# Patient Record
Sex: Female | Born: 1940 | Race: White | Hispanic: No | Marital: Married | Smoking: Former smoker
Health system: Southern US, Community
[De-identification: ages and names within clinical notes are randomized; demographics above are authoritative.]

## PROBLEM LIST (undated history)

## (undated) DIAGNOSIS — E785 Hyperlipidemia, unspecified: Secondary | ICD-10-CM

## (undated) DIAGNOSIS — I10 Essential (primary) hypertension: Secondary | ICD-10-CM

## (undated) DIAGNOSIS — E119 Type 2 diabetes mellitus without complications: Secondary | ICD-10-CM

## (undated) DIAGNOSIS — C439 Malignant melanoma of skin, unspecified: Secondary | ICD-10-CM

## (undated) DIAGNOSIS — F329 Major depressive disorder, single episode, unspecified: Secondary | ICD-10-CM

## (undated) DIAGNOSIS — C801 Malignant (primary) neoplasm, unspecified: Secondary | ICD-10-CM

## (undated) DIAGNOSIS — F419 Anxiety disorder, unspecified: Secondary | ICD-10-CM

## (undated) DIAGNOSIS — F0150 Vascular dementia without behavioral disturbance: Secondary | ICD-10-CM

## (undated) DIAGNOSIS — F32A Depression, unspecified: Secondary | ICD-10-CM

## (undated) DIAGNOSIS — M810 Age-related osteoporosis without current pathological fracture: Secondary | ICD-10-CM

## (undated) DIAGNOSIS — M1990 Unspecified osteoarthritis, unspecified site: Secondary | ICD-10-CM

## (undated) DIAGNOSIS — Z87891 Personal history of nicotine dependence: Secondary | ICD-10-CM

## (undated) HISTORY — DX: Age-related osteoporosis without current pathological fracture: M81.0

## (undated) HISTORY — PX: MELANOMA EXCISION: SHX5266

## (undated) HISTORY — PX: TUBAL LIGATION: SHX77

## (undated) HISTORY — DX: Depression, unspecified: F32.A

## (undated) HISTORY — DX: Major depressive disorder, single episode, unspecified: F32.9

## (undated) HISTORY — DX: Type 2 diabetes mellitus without complications: E11.9

## (undated) HISTORY — DX: Anxiety disorder, unspecified: F41.9

---

## 2005-02-17 ENCOUNTER — Ambulatory Visit: Payer: Self-pay | Admitting: Obstetrics and Gynecology

## 2005-06-09 ENCOUNTER — Ambulatory Visit: Payer: Self-pay | Admitting: Unknown Physician Specialty

## 2005-11-24 LAB — HM COLONOSCOPY

## 2006-03-23 ENCOUNTER — Ambulatory Visit: Payer: Self-pay | Admitting: Obstetrics and Gynecology

## 2007-03-29 ENCOUNTER — Ambulatory Visit: Payer: Self-pay | Admitting: Obstetrics and Gynecology

## 2008-03-30 ENCOUNTER — Ambulatory Visit: Payer: Self-pay | Admitting: Obstetrics and Gynecology

## 2009-04-02 ENCOUNTER — Ambulatory Visit: Payer: Self-pay | Admitting: Obstetrics and Gynecology

## 2010-04-03 ENCOUNTER — Ambulatory Visit: Payer: Self-pay | Admitting: Obstetrics and Gynecology

## 2010-09-26 ENCOUNTER — Ambulatory Visit: Payer: Self-pay | Admitting: Internal Medicine

## 2011-07-02 ENCOUNTER — Ambulatory Visit: Payer: Self-pay | Admitting: Obstetrics and Gynecology

## 2011-11-25 HISTORY — PX: BUNIONECTOMY: SHX129

## 2012-07-05 ENCOUNTER — Ambulatory Visit: Payer: Self-pay | Admitting: Obstetrics and Gynecology

## 2014-03-24 LAB — HM MAMMOGRAPHY: HM Mammogram: NORMAL

## 2014-04-20 ENCOUNTER — Ambulatory Visit: Payer: Self-pay | Admitting: Obstetrics and Gynecology

## 2014-11-13 LAB — LIPID PANEL
CHOLESTEROL: 231 mg/dL — AB (ref 0–200)
HDL: 67 mg/dL (ref 35–70)
LDL Cholesterol: 138 mg/dL
Triglycerides: 132 mg/dL (ref 40–160)

## 2014-11-13 LAB — HEMOGLOBIN A1C: Hgb A1c MFr Bld: 6.7 % — AB (ref 4.0–6.0)

## 2014-11-13 LAB — BASIC METABOLIC PANEL
BUN: 11 mg/dL (ref 4–21)
CREATININE: 0.8 mg/dL (ref <=1.1)

## 2015-03-15 ENCOUNTER — Encounter: Payer: Self-pay | Admitting: Internal Medicine

## 2015-03-15 DIAGNOSIS — E559 Vitamin D deficiency, unspecified: Secondary | ICD-10-CM | POA: Insufficient documentation

## 2015-03-15 DIAGNOSIS — N3941 Urge incontinence: Secondary | ICD-10-CM | POA: Insufficient documentation

## 2015-03-15 DIAGNOSIS — E785 Hyperlipidemia, unspecified: Secondary | ICD-10-CM

## 2015-03-15 DIAGNOSIS — J302 Other seasonal allergic rhinitis: Secondary | ICD-10-CM | POA: Insufficient documentation

## 2015-03-15 DIAGNOSIS — E1169 Type 2 diabetes mellitus with other specified complication: Secondary | ICD-10-CM | POA: Insufficient documentation

## 2015-03-15 DIAGNOSIS — E749 Disorder of carbohydrate metabolism, unspecified: Secondary | ICD-10-CM | POA: Insufficient documentation

## 2015-03-15 DIAGNOSIS — I10 Essential (primary) hypertension: Secondary | ICD-10-CM | POA: Insufficient documentation

## 2015-03-15 DIAGNOSIS — M810 Age-related osteoporosis without current pathological fracture: Secondary | ICD-10-CM | POA: Insufficient documentation

## 2015-05-24 ENCOUNTER — Encounter: Payer: Self-pay | Admitting: Internal Medicine

## 2015-05-24 ENCOUNTER — Ambulatory Visit (INDEPENDENT_AMBULATORY_CARE_PROVIDER_SITE_OTHER): Payer: Medicare Other | Admitting: Internal Medicine

## 2015-05-24 ENCOUNTER — Encounter (INDEPENDENT_AMBULATORY_CARE_PROVIDER_SITE_OTHER): Payer: Self-pay

## 2015-05-24 VITALS — BP 110/60 | HR 84 | Ht 62.0 in | Wt 184.4 lb

## 2015-05-24 DIAGNOSIS — M25552 Pain in left hip: Secondary | ICD-10-CM

## 2015-05-24 DIAGNOSIS — Z1239 Encounter for other screening for malignant neoplasm of breast: Secondary | ICD-10-CM | POA: Diagnosis not present

## 2015-05-24 DIAGNOSIS — I10 Essential (primary) hypertension: Secondary | ICD-10-CM | POA: Diagnosis not present

## 2015-05-24 DIAGNOSIS — E749 Disorder of carbohydrate metabolism, unspecified: Secondary | ICD-10-CM

## 2015-05-24 DIAGNOSIS — G8929 Other chronic pain: Secondary | ICD-10-CM | POA: Insufficient documentation

## 2015-05-24 DIAGNOSIS — E785 Hyperlipidemia, unspecified: Secondary | ICD-10-CM | POA: Diagnosis not present

## 2015-05-24 NOTE — Progress Notes (Signed)
Date:  05/24/2015   Name:  Melody Jones   DOB:  [birth year]   MRN:  [id number]   Chief Complaint: Follow up DM Diabetes She presents for her follow-up (of glucose intolerance/pre diabetes) diabetic visit. Her disease course has been stable. Pertinent negatives for hypoglycemia include no headaches. There are no diabetic associated symptoms. Pertinent negatives for diabetes include no chest pain. Symptoms are stable. Pertinent negatives for diabetic complications include no retinopathy. Current diabetic treatment includes diet. She is compliant with treatment most of the time. Home blood sugar record trend: she is not checking blood sugar.  Hypertension This is a chronic problem. The current episode started more than 1 year ago. The problem is unchanged. The problem is controlled. Pertinent negatives include no chest pain, headaches, palpitations or shortness of breath. There are no associated agents to hypertension. Risk factors for coronary artery disease include diabetes mellitus and dyslipidemia. Past treatments include ACE inhibitors. The current treatment provides significant improvement. There are no compliance problems.  There is no history of kidney disease, CAD/MI or retinopathy. There is no history of chronic renal disease.  Hyperlipidemia This is a chronic problem. The problem is controlled. Recent lipid tests were reviewed and are variable. Exacerbating diseases include diabetes. She has no history of chronic renal disease or liver disease. Pertinent negatives include no chest pain or shortness of breath. Current antihyperlipidemic treatment includes statins and diet change. The current treatment provides moderate improvement of lipids. There are no compliance problems.   Hip Pain  The incident occurred more than 1 week ago. There was no injury mechanism. The pain is present in the left leg and left hip. The quality of the pain is described as aching. The pain is mild. The pain has been  fluctuating since onset. Pertinent negatives include no numbness or tingling. The symptoms are aggravated by movement and weight bearing. She has tried NSAIDs (seen by Dr. Tamala Julian - recommended nsaids, no injections or surgery needed) for the symptoms.    Review of Systems:  Review of Systems  Constitutional: Negative for fever, activity change, appetite change and unexpected weight change.  HENT: Negative for hearing loss.   Eyes: Negative for visual disturbance.  Respiratory: Negative for cough, chest tightness and shortness of breath.   Cardiovascular: Negative for chest pain, palpitations and leg swelling.  Gastrointestinal: Negative for abdominal pain.  Genitourinary: Positive for frequency. Negative for dysuria.  Musculoskeletal: Positive for arthralgias.  Neurological: Negative for tingling, numbness and headaches.    Patient Active Problem List   Diagnosis Date Noted  . Carbohydrate metabolic effects 77/41/2878  . Dyslipidemia 03/15/2015  . Essential (primary) hypertension 03/15/2015  . OP (osteoporosis) 03/15/2015  . Allergic rhinitis, seasonal 03/15/2015  . Urge incontinence 03/15/2015  . Avitaminosis D 03/15/2015    Prior to Admission medications   Medication Sig Start Date End Date Taking? Authorizing Provider  atorvastatin (LIPITOR) 10 MG tablet Take 1 tablet by mouth daily. 11/13/14   Historical Provider, MD  fesoterodine (TOVIAZ) 8 MG TB24 tablet Take 1 tablet by mouth daily.    Historical Provider, MD  lisinopril (PRINIVIL,ZESTRIL) 20 MG tablet Take 1 tablet by mouth daily. 10/08/14   Historical Provider, MD    Allergies no known allergies  Past Surgical History  Procedure Laterality Date  . Tubal ligation    . Bunionectomy  2013    History  Substance Use Topics  . Smoking status: Never Smoker   . Smokeless tobacco: Not on file  . Alcohol Use:  Not on file   Lab Results  Component Value Date   HGBA1C 6.7* 11/13/2014   Lab Results  Component Value  Date   CHOL 231* 11/13/2014   HDL 67 11/13/2014   LDLCALC 138 11/13/2014   TRIG 132 11/13/2014      Medication list has been reviewed and updated.  Physical Examination:  Physical Exam  Constitutional: She is oriented to person, place, and time. She appears well-developed and well-nourished.  Neck: Normal range of motion. Neck supple. No tracheal tenderness present. Carotid bruit is not present. No thyromegaly present.  Cardiovascular: Normal rate, regular rhythm and normal heart sounds.   Pulmonary/Chest: Effort normal and breath sounds normal. No respiratory distress. She has no wheezes.  Musculoskeletal: She exhibits tenderness (left hip). She exhibits no edema.       Right knee: She exhibits no swelling and no effusion.       Left knee: She exhibits no swelling and no effusion.  Lymphadenopathy:    She has no cervical adenopathy.  Neurological: She is alert and oriented to person, place, and time. She has normal reflexes.  Psychiatric: She has a normal mood and affect.  Nursing note and vitals reviewed.   BP 110/60 mmHg  Pulse 84  Ht 5\' 2"  (1.575 m)  Wt 184 lb 6.4 oz (83.643 kg)  BMI 33.72 kg/m2  Assessment and Plan: 1. Carbohydrate metabolic effects Continue low carb diet - Hemoglobin A1c  2. Essential (primary) hypertension controlled - Comprehensive metabolic panel  3. Dyslipidemia On statin therapy  4. Breast cancer screening - MM DIGITAL SCREENING BILATERAL; Future  5. Chronic left hip pain Continue mobic as needed Consider regular exercise program such as water aerobics   Halina Maidens, MD [facility]  05/24/2015

## 2015-05-25 LAB — COMPREHENSIVE METABOLIC PANEL
ALT: 20 IU/L (ref 0–32)
AST: 17 IU/L (ref 0–40)
Albumin/Globulin Ratio: 1.7 (ref 1.1–2.5)
Albumin: 4.6 g/dL (ref 3.5–4.8)
Alkaline Phosphatase: 60 IU/L (ref 39–117)
BUN/Creatinine Ratio: 18 (ref 11–26)
BUN: 12 mg/dL (ref 8–27)
Bilirubin Total: 0.4 mg/dL (ref 0.0–1.2)
CO2: 25 mmol/L (ref 18–29)
Calcium: 9.5 mg/dL (ref 8.7–10.3)
Chloride: 103 mmol/L (ref 97–108)
Creatinine, Ser: 0.66 mg/dL (ref 0.57–1.00)
GFR calc Af Amer: 101 mL/min/{1.73_m2} (ref >59)
GFR calc non Af Amer: 88 mL/min/{1.73_m2} (ref >59)
Globulin, Total: 2.7 g/dL (ref 1.5–4.5)
Glucose: 147 mg/dL — ABNORMAL HIGH (ref 65–99)
Potassium: 4.7 mmol/L (ref 3.5–5.2)
SODIUM: 142 mmol/L (ref 134–144)
TOTAL PROTEIN: 7.3 g/dL (ref 6.0–8.5)

## 2015-05-25 LAB — HEMOGLOBIN A1C
Est. average glucose Bld gHb Est-mCnc: 148 mg/dL
Hgb A1c MFr Bld: 6.8 % — ABNORMAL HIGH (ref 4.8–5.6)

## 2015-06-05 DIAGNOSIS — Z85820 Personal history of malignant melanoma of skin: Secondary | ICD-10-CM | POA: Insufficient documentation

## 2015-06-13 ENCOUNTER — Encounter: Payer: Self-pay | Admitting: *Deleted

## 2015-06-14 ENCOUNTER — Ambulatory Visit: Payer: Medicare Other | Admitting: Anesthesiology

## 2015-06-14 ENCOUNTER — Ambulatory Visit
Admission: RE | Admit: 2015-06-14 | Discharge: 2015-06-14 | Disposition: A | Discharge status: Home Health | Payer: Medicare Other | Source: Ambulatory Visit | Attending: Unknown Physician Specialty | Admitting: Unknown Physician Specialty

## 2015-06-14 ENCOUNTER — Encounter
Admission: RE | Disposition: A | Discharge status: Home Health | Payer: Self-pay | Source: Ambulatory Visit | Attending: Unknown Physician Specialty

## 2015-06-14 ENCOUNTER — Encounter: Payer: Self-pay | Admitting: *Deleted

## 2015-06-14 DIAGNOSIS — D123 Benign neoplasm of transverse colon: Secondary | ICD-10-CM | POA: Diagnosis not present

## 2015-06-14 DIAGNOSIS — Z87891 Personal history of nicotine dependence: Secondary | ICD-10-CM | POA: Insufficient documentation

## 2015-06-14 DIAGNOSIS — Z85820 Personal history of malignant melanoma of skin: Secondary | ICD-10-CM | POA: Diagnosis not present

## 2015-06-14 DIAGNOSIS — K640 First degree hemorrhoids: Secondary | ICD-10-CM | POA: Diagnosis not present

## 2015-06-14 DIAGNOSIS — Z1211 Encounter for screening for malignant neoplasm of colon: Secondary | ICD-10-CM | POA: Diagnosis not present

## 2015-06-14 DIAGNOSIS — E785 Hyperlipidemia, unspecified: Secondary | ICD-10-CM | POA: Diagnosis not present

## 2015-06-14 DIAGNOSIS — K5730 Diverticulosis of large intestine without perforation or abscess without bleeding: Secondary | ICD-10-CM | POA: Diagnosis not present

## 2015-06-14 DIAGNOSIS — K621 Rectal polyp: Secondary | ICD-10-CM | POA: Diagnosis not present

## 2015-06-14 DIAGNOSIS — Z6833 Body mass index (BMI) 33.0-33.9, adult: Secondary | ICD-10-CM | POA: Insufficient documentation

## 2015-06-14 DIAGNOSIS — E669 Obesity, unspecified: Secondary | ICD-10-CM | POA: Diagnosis not present

## 2015-06-14 DIAGNOSIS — I10 Essential (primary) hypertension: Secondary | ICD-10-CM | POA: Insufficient documentation

## 2015-06-14 HISTORY — DX: Hyperlipidemia, unspecified: E78.5

## 2015-06-14 HISTORY — DX: Essential (primary) hypertension: I10

## 2015-06-14 HISTORY — PX: COLONOSCOPY: SHX5424

## 2015-06-14 HISTORY — DX: Malignant (primary) neoplasm, unspecified: C80.1

## 2015-06-14 SURGERY — COLONOSCOPY
Anesthesia: General

## 2015-06-14 MED ORDER — SODIUM CHLORIDE 0.9 % IV SOLN: INTRAVENOUS | Status: DC

## 2015-06-14 MED ORDER — PROPOFOL INFUSION 10 MG/ML OPTIME
INTRAVENOUS | Status: DC | PRN
  Administered 2015-06-14: 120 ug/kg/min via INTRAVENOUS

## 2015-06-14 MED ORDER — MIDAZOLAM HCL 5 MG/5ML IJ SOLN
INTRAMUSCULAR | Status: DC | PRN
  Administered 2015-06-14: 1 mg via INTRAVENOUS

## 2015-06-14 MED ORDER — FENTANYL CITRATE (PF) 100 MCG/2ML IJ SOLN
INTRAMUSCULAR | Status: DC | PRN
  Administered 2015-06-14: 50 ug via INTRAVENOUS

## 2015-06-14 MED ORDER — PROPOFOL 10 MG/ML IV BOLUS
INTRAVENOUS | Status: DC | PRN
  Administered 2015-06-14: 8 mg via INTRAVENOUS
  Administered 2015-06-14: 55 mg via INTRAVENOUS

## 2015-06-14 MED ORDER — SODIUM CHLORIDE 0.9 % IV SOLN
INTRAVENOUS | Status: DC
  Administered 2015-06-14: 10:00:00 via INTRAVENOUS

## 2015-06-14 NOTE — H&P (Signed)
Primary Care Physician:  Halina Maidens, MD Primary Gastroenterologist:  Dr. Vira Agar  Pre-Procedure History & Physical: HPI:  Melody Jones is a 74 y.o. female is here for an colonoscopy.   Past Medical History  Diagnosis Date  . Hypertension   . Hyperlipidemia   . Cancer     Melanoma LT Shoulder    Past Surgical History  Procedure Laterality Date  . Tubal ligation    . Bunionectomy  2013  . Melanoma excision Left Shoulder    Prior to Admission medications   Medication Sig Start Date End Date Taking? Authorizing Provider  fesoterodine (TOVIAZ) 8 MG TB24 tablet Take 1 tablet by mouth daily.   Yes Historical Provider, MD  lisinopril (PRINIVIL,ZESTRIL) 20 MG tablet Take 1 tablet by mouth daily. 10/08/14  Yes Historical Provider, MD  VITAMIN D, ERGOCALCIFEROL, PO Take 1 tablet by mouth.   Yes Historical Provider, MD  atorvastatin (LIPITOR) 10 MG tablet Take 1 tablet by mouth daily. 11/13/14   Historical Provider, MD  meloxicam (MOBIC) 15 MG tablet Take 15 mg by mouth daily as needed for pain.    Historical Provider, MD    Allergies as of 06/13/2015  . (No Known Allergies)    Family History  Problem Relation Age of Onset  . Alcohol abuse Mother   . Stroke Father     History   Social History  . Marital Status: Married    Spouse Name: N/A  . Number of Children: N/A  . Years of Education: N/A   Occupational History  . Not on file.   Social History Main Topics  . Smoking status: Former Research scientist (life sciences)  . Smokeless tobacco: Never Used  . Alcohol Use: 0.0 oz/week    0 Standard drinks or equivalent per week  . Drug Use: No  . Sexual Activity: Not on file   Other Topics Concern  . Not on file   Social History Narrative    Review of Systems: See HPI, otherwise negative ROS  Physical Exam: BP 100/80 mmHg  Pulse 93  Temp(Src) 96.6 F (35.9 C) (Tympanic)  Resp 18  Ht 5\' 1"  (1.549 m)  Wt 80.74 kg (178 lb)  BMI 33.65 kg/m2  SpO2 97% General:   Alert,  pleasant  and cooperative in NAD Head:  Normocephalic and atraumatic. Neck:  Supple; no masses or thyromegaly. Lungs:  Clear throughout to auscultation.    Heart:  Regular rate and rhythm. Abdomen:  Soft, nontender and nondistended. Normal bowel sounds, without guarding, and without rebound.   Neurologic:  Alert and  oriented x4;  grossly normal neurologically.  Impression/Plan: Melody Novas Weight is here for an colonoscopy to be performed for screening  Risks, benefits, limitations, and alternatives regarding  colonoscopy have been reviewed with the patient.  Questions have been answered.  All parties agreeable.   Gaylyn Cheers, MD  06/14/2015, 10:50 AM   Primary Care Physician:  Halina Maidens, MD Primary Gastroenterologist:  Dr. Vira Agar  Pre-Procedure History & Physical: HPI:  Melody Jones is a 74 y.o. female is here for an colonoscopy.   Past Medical History  Diagnosis Date  . Hypertension   . Hyperlipidemia   . Cancer     Melanoma LT Shoulder    Past Surgical History  Procedure Laterality Date  . Tubal ligation    . Bunionectomy  2013  . Melanoma excision Left Shoulder    Prior to Admission medications   Medication Sig Start Date End Date Taking? Authorizing Provider  fesoterodine (TOVIAZ) 8 MG TB24 tablet Take 1 tablet by mouth daily.   Yes Historical Provider, MD  lisinopril (PRINIVIL,ZESTRIL) 20 MG tablet Take 1 tablet by mouth daily. 10/08/14  Yes Historical Provider, MD  VITAMIN D, ERGOCALCIFEROL, PO Take 1 tablet by mouth.   Yes Historical Provider, MD  atorvastatin (LIPITOR) 10 MG tablet Take 1 tablet by mouth daily. 11/13/14   Historical Provider, MD  meloxicam (MOBIC) 15 MG tablet Take 15 mg by mouth daily as needed for pain.    Historical Provider, MD    Allergies as of 06/13/2015  . (No Known Allergies)    Family History  Problem Relation Age of Onset  . Alcohol abuse Mother   . Stroke Father     History   Social History  . Marital Status: Married     Spouse Name: N/A  . Number of Children: N/A  . Years of Education: N/A   Occupational History  . Not on file.   Social History Main Topics  . Smoking status: Former Research scientist (life sciences)  . Smokeless tobacco: Never Used  . Alcohol Use: 0.0 oz/week    0 Standard drinks or equivalent per week  . Drug Use: No  . Sexual Activity: Not on file   Other Topics Concern  . Not on file   Social History Narrative    Review of Systems: See HPI, otherwise negative ROS  Physical Exam: BP 100/80 mmHg  Pulse 93  Temp(Src) 96.6 F (35.9 C) (Tympanic)  Resp 18  Ht 5\' 1"  (1.549 m)  Wt 80.74 kg (178 lb)  BMI 33.65 kg/m2  SpO2 97% General:   Alert,  pleasant and cooperative in NAD Head:  Normocephalic and atraumatic. Neck:  Supple; no masses or thyromegaly. Lungs:  Clear throughout to auscultation.    Heart:  Regular rate and rhythm. Abdomen:  Soft, nontender and nondistended. Normal bowel sounds, without guarding, and without rebound.   Neurologic:  Alert and  oriented x4;  grossly normal neurologically.  Impression/Plan: Melody Jones is here for an colonoscopy to be performed for screening  Risks, benefits, limitations, and alternatives regarding  colonoscopy have been reviewed with the patient.  Questions have been answered.  All parties agreeable.   Gaylyn Cheers, MD  06/14/2015, 10:50 AM

## 2015-06-14 NOTE — Anesthesia Preprocedure Evaluation (Signed)
Anesthesia Evaluation  Patient identified by MRN, date of birth, ID band Patient awake    Reviewed: Allergy & Precautions, NPO status , Patient's Chart, lab work & pertinent test results  Airway Mallampati: III       Dental  (+) Caps   Pulmonary former smoker,    Pulmonary exam normal       Cardiovascular hypertension, Pt. on medications Normal cardiovascular exam    Neuro/Psych    GI/Hepatic negative GI ROS, Neg liver ROS,   Endo/Other  negative endocrine ROS  Renal/GU negative Renal ROS     Musculoskeletal negative musculoskeletal ROS (+)   Abdominal (+) + obese,   Peds  Hematology negative hematology ROS (+)   Anesthesia Other Findings   Reproductive/Obstetrics negative OB ROS                             Anesthesia Physical Anesthesia Plan  ASA: II  Anesthesia Plan: General   Post-op Pain Management:    Induction: Intravenous  Airway Management Planned: Nasal Cannula  Additional Equipment:   Intra-op Plan:   Post-operative Plan:   Informed Consent: I have reviewed the patients History and Physical, chart, labs and discussed the procedure including the risks, benefits and alternatives for the proposed anesthesia with the patient or authorized representative who has indicated his/her understanding and acceptance.     Plan Discussed with: CRNA  Anesthesia Plan Comments:         Anesthesia Quick Evaluation

## 2015-06-14 NOTE — Op Note (Addendum)
[facility] Gastroenterology Patient Name: Melody Jones Procedure Date: 06/14/2015 10:48 AM MRN: [id number] Account #: [id number] Date of Birth: 1941/11/05 Admit Type: Outpatient Age: 74 Room: [facility] ENDO ROOM 1 Gender: Female Note Status: Finalized Procedure:         Colonoscopy Indications:       Screening for colorectal malignant neoplasm Providers:         Manya Silvas, MD Referring MD:      Halina Maidens, MD (Referring MD) Medicines:         Propofol per Anesthesia Complications:     No immediate complications. Procedure:         Pre-Anesthesia Assessment:                    - After reviewing the risks and benefits, the patient was                     deemed in satisfactory condition to undergo the procedure.                    After obtaining informed consent, the colonoscope was                     passed under direct vision. Throughout the procedure, the                     patient's blood pressure, pulse, and oxygen saturations                     were monitored continuously. The Colonoscope was                     introduced through the anus and advanced to the the cecum,                     identified by appendiceal orifice and ileocecal valve. The                     colonoscopy was performed without difficulty. The patient                     tolerated the procedure well. The quality of the bowel                     preparation was good. Findings:      A diminutive polyp was found in the transverse colon. The polyp was       sessile. The polyp was removed with a jumbo cold forceps. Resection and       retrieval were complete.      A small polyp was found at the splenic flexure. The polyp was sessile.       The polyp was removed with a hot snare. Resection and retrieval were       complete.      A diminutive polyp was found in the rectum. The polyp was sessile. The       polyp was removed with a jumbo cold forceps. Resection and retrieval   were complete.      Multiple small-mouthed diverticula were found in the sigmoid colon and       in the descending colon.      Internal hemorrhoids were found during endoscopy. The hemorrhoids were       small and Grade I (internal hemorrhoids that do not prolapse).  The exam was otherwise without abnormality. Impression:        - One diminutive polyp in the transverse colon. Resected                     and retrieved.                    - One small polyp at the splenic flexure. Resected and                     retrieved.                    - One diminutive polyp in the rectum. Resected and                     retrieved.                    - Diverticulosis in the sigmoid colon and in the                     descending colon.                    - Internal hemorrhoids.                    - The examination was otherwise normal. Recommendation:    - Await pathology results. Manya Silvas, MD 06/14/2015 11:24:04 AM This report has been signed electronically. Number of Addenda: 0 Note Initiated On: 06/14/2015 10:48 AM Scope Withdrawal Time: 0 hours 17 minutes 7 seconds  Total Procedure Duration: 0 hours 25 minutes 41 seconds       [facility]

## 2015-06-14 NOTE — Anesthesia Postprocedure Evaluation (Signed)
  Anesthesia Post-op Note  Patient: Melody Jones  Procedure(s) Performed: Procedure(s): COLONOSCOPY (N/A)  Anesthesia type:General  Patient location: PACU  Post pain: Pain level controlled  Post assessment: Post-op Vital signs reviewed, Patient's Cardiovascular Status Stable, Respiratory Function Stable, Patent Airway and No signs of Nausea or vomiting  Post vital signs: Reviewed and stable  Last Vitals:  Filed Vitals:   06/14/15 1140  BP: 110/75  Pulse: 73  Temp:   Resp: 16    Level of consciousness: awake, alert  and patient cooperative  Complications: No apparent anesthesia complications

## 2015-06-14 NOTE — Transfer of Care (Signed)
Immediate Anesthesia Transfer of Care Note  Patient: Melody Jones  Procedure(s) Performed: Procedure(s): COLONOSCOPY (N/A)  Patient Location: PACU  Anesthesia Type:General  Level of Consciousness: sedated  Airway & Oxygen Therapy: Patient Spontanous Breathing and Patient connected to nasal cannula oxygen  Post-op Assessment: Report given to RN and Post -op Vital signs reviewed and stable  Post vital signs: Reviewed and stable  Last Vitals:  Filed Vitals:   06/14/15 1130  BP: 114/92  Pulse:   Temp: 36.1 C  Resp: 16    Complications: No apparent anesthesia complications

## 2015-06-15 LAB — SURGICAL PATHOLOGY

## 2015-06-18 ENCOUNTER — Encounter: Payer: Self-pay | Admitting: Unknown Physician Specialty

## 2015-10-17 ENCOUNTER — Ambulatory Visit
Admission: RE | Admit: 2015-10-17 | Discharge: 2015-10-17 | Disposition: A | Discharge status: Home / Self Care | Payer: Medicare Other | Source: Ambulatory Visit | Attending: Internal Medicine | Admitting: Internal Medicine

## 2015-10-17 DIAGNOSIS — Z1239 Encounter for other screening for malignant neoplasm of breast: Secondary | ICD-10-CM

## 2015-10-17 DIAGNOSIS — Z1231 Encounter for screening mammogram for malignant neoplasm of breast: Secondary | ICD-10-CM | POA: Diagnosis present

## 2015-11-10 ENCOUNTER — Other Ambulatory Visit: Payer: Self-pay | Admitting: Internal Medicine

## 2015-11-15 ENCOUNTER — Ambulatory Visit (INDEPENDENT_AMBULATORY_CARE_PROVIDER_SITE_OTHER): Payer: Medicare Other | Admitting: Internal Medicine

## 2015-11-15 ENCOUNTER — Encounter: Payer: Self-pay | Admitting: Internal Medicine

## 2015-11-15 ENCOUNTER — Other Ambulatory Visit: Payer: Self-pay | Admitting: Internal Medicine

## 2015-11-15 VITALS — BP 130/84 | HR 68 | Ht 62.0 in | Wt 178.0 lb

## 2015-11-15 DIAGNOSIS — Z0000 Encounter for general adult medical examination without abnormal findings: Secondary | ICD-10-CM

## 2015-11-15 DIAGNOSIS — E749 Disorder of carbohydrate metabolism, unspecified: Secondary | ICD-10-CM

## 2015-11-15 DIAGNOSIS — N3941 Urge incontinence: Secondary | ICD-10-CM | POA: Diagnosis not present

## 2015-11-15 DIAGNOSIS — I10 Essential (primary) hypertension: Secondary | ICD-10-CM

## 2015-11-15 DIAGNOSIS — E785 Hyperlipidemia, unspecified: Secondary | ICD-10-CM

## 2015-11-15 DIAGNOSIS — G8929 Other chronic pain: Secondary | ICD-10-CM | POA: Diagnosis not present

## 2015-11-15 DIAGNOSIS — M25552 Pain in left hip: Secondary | ICD-10-CM | POA: Diagnosis not present

## 2015-11-15 DIAGNOSIS — Z85820 Personal history of malignant melanoma of skin: Secondary | ICD-10-CM

## 2015-11-15 LAB — POCT URINALYSIS DIPSTICK
BILIRUBIN UA: NEGATIVE
Glucose, UA: NEGATIVE
Ketones, UA: NEGATIVE
LEUKOCYTES UA: NEGATIVE
NITRITE UA: NEGATIVE
PH UA: 5
PROTEIN UA: NEGATIVE
RBC UA: NEGATIVE
Spec Grav, UA: 1.01
UROBILINOGEN UA: 0.2

## 2015-11-15 MED ORDER — LISINOPRIL 20 MG PO TABS: 20.0000 mg | ORAL_TABLET | Freq: Every day | ORAL | Status: DC

## 2015-11-15 MED ORDER — ATORVASTATIN CALCIUM 10 MG PO TABS: 10.0000 mg | ORAL_TABLET | Freq: Every day | ORAL | Status: DC

## 2015-11-15 NOTE — Progress Notes (Signed)
Patient: Melody Jones, Female    DOB: Feb 21, 1941, 74 y.o.   MRN: [id number] Visit Date: 11/15/2015  Today's Provider: Halina Maidens, MD   Chief Complaint  Patient presents with  . Medicare Wellness  . Hypertension  . Hyperlipidemia   Subjective:    Annual wellness visit Melody Jones is a 74 y.o. female who presents today for her Subsequent Annual Wellness Visit. She feels well. She reports exercising occasionally. She reports she is sleeping well.   ----------------------------------------------------------- Hypertension This is a chronic problem. The current episode started more than 1 year ago. The problem is unchanged. The problem is controlled. Pertinent negatives include no chest pain, headaches or palpitations. Past treatments include ACE inhibitors. The current treatment provides significant improvement. There are no compliance problems.  There is no history of a thyroid problem. There is no history of chronic renal disease or hyperparathyroidism.  Hyperlipidemia This is a chronic problem. The current episode started more than 1 year ago. The problem is controlled. She has no history of chronic renal disease. Pertinent negatives include no chest pain. Current antihyperlipidemic treatment includes statins. The current treatment provides significant improvement of lipids.  Urinary Frequency  This is a chronic problem. The problem occurs every urination. Associated symptoms include frequency and urgency. Treatments tried: [place]. The treatment provided significant relief.    Review of Systems  Constitutional: Negative for fever, fatigue and unexpected weight change.  HENT: Negative for hearing loss, sore throat, tinnitus and trouble swallowing.   Eyes: Negative for visual disturbance.  Cardiovascular: Negative for chest pain, palpitations and leg swelling.  Gastrointestinal: Negative for abdominal pain (occasional gerd), diarrhea, constipation and blood in stool.    Endocrine: Negative for polydipsia and polyuria.  Genitourinary: Positive for urgency and frequency. Negative for vaginal discharge and pelvic pain.  Musculoskeletal: Positive for arthralgias.  Skin: Negative for color change and rash.  Allergic/Immunologic: Negative for food allergies.  Neurological: Negative for tremors, syncope, speech difficulty, light-headedness and headaches.  Hematological: Negative for adenopathy.  Psychiatric/Behavioral: Negative for sleep disturbance, dysphoric mood and decreased concentration.    Social History   Social History  . Marital Status: Married    Spouse Name: N/A  . Number of Children: N/A  . Years of Education: N/A   Occupational History  . Not on file.   Social History Main Topics  . Smoking status: Former Research scientist (life sciences)  . Smokeless tobacco: Never Used  . Alcohol Use: 0.0 oz/week    0 Standard drinks or equivalent per week  . Drug Use: No  . Sexual Activity: Not Currently   Other Topics Concern  . Not on file   Social History Narrative    Patient Active Problem List   Diagnosis Date Noted  . Hx of malignant melanoma of skin 06/05/2015  . Chronic left hip pain 05/24/2015  . Carbohydrate metabolic effects ([place]) 0000000  . Dyslipidemia 03/15/2015  . Essential (primary) hypertension 03/15/2015  . OP (osteoporosis) 03/15/2015  . Allergic rhinitis, seasonal 03/15/2015  . Urge incontinence 03/15/2015  . Avitaminosis D 03/15/2015    Past Surgical History  Procedure Laterality Date  . Tubal ligation    . Bunionectomy  2013  . Melanoma excision Left Shoulder  . Colonoscopy N/A 06/14/2015    Procedure: COLONOSCOPY;  Surgeon: Manya Silvas, MD;  Location: [facility] ENDOSCOPY;  Service: Endoscopy;  Laterality: N/A;    Her family history includes Alcohol abuse in her mother; Stroke in her father.    Previous Medications   ATORVASTATIN (  LIPITOR) 10 MG TABLET    TAKE 1 TABLET AT BEDTIME   CHOLECALCIFEROL (VITAMIN D-1000 MAX ST) 1000  UNITS TABLET    Take 1 tablet by mouth daily.   FESOTERODINE (TOVIAZ) 8 MG TB24 TABLET    Take 1 tablet by mouth daily.   LISINOPRIL (PRINIVIL,ZESTRIL) 20 MG TABLET    Take 1 tablet by mouth daily.   MELOXICAM (MOBIC) 15 MG TABLET    Take 15 mg by mouth daily as needed for pain.   VITAMIN D, ERGOCALCIFEROL, PO    Take 1 tablet by mouth.    Patient Care Team: Glean Hess, MD as PCP - General (Family Medicine) Christophe Louis, MD as Referring Physician (Orthopedic Surgery)     Objective:   Vitals: BP 130/84 mmHg  Pulse 68  Ht 5\' 2"  (1.575 m)  Wt 178 lb (80.74 kg)  BMI 32.55 kg/m2  Physical Exam  Constitutional: She is oriented to person, place, and time. She appears well-developed and well-nourished. No distress.  HENT:  Head: Normocephalic and atraumatic.  Right Ear: Tympanic membrane and ear canal normal.  Left Ear: Tympanic membrane and ear canal normal.  Nose: Right sinus exhibits no maxillary sinus tenderness. Left sinus exhibits no maxillary sinus tenderness.  Mouth/Throat: Uvula is midline and oropharynx is clear and moist.  Eyes: Conjunctivae and EOM are normal. Right eye exhibits no discharge. Left eye exhibits no discharge. No scleral icterus.  Neck: Normal range of motion. Carotid bruit is not present. No erythema present. No thyromegaly present.  Cardiovascular: Normal rate, regular rhythm, normal heart sounds and normal pulses.   Pulmonary/Chest: Effort normal. No respiratory distress. She has no wheezes.  Abdominal: Soft. Bowel sounds are normal. There is no hepatosplenomegaly. There is no tenderness. There is no CVA tenderness.  Musculoskeletal: Normal range of motion.  Lymphadenopathy:    She has no cervical adenopathy.    She has no axillary adenopathy.  Neurological: She is alert and oriented to person, place, and time. She has normal reflexes. No cranial nerve deficit or sensory deficit.  Skin: Skin is warm, dry and intact. No rash noted.  Psychiatric:  She has a normal mood and affect. Her speech is normal and behavior is normal. Thought content normal.  Nursing note and vitals reviewed.   Activities of Daily Living In your present state of health, do you have any difficulty performing the following activities: 11/15/2015 05/24/2015  Hearing? N N  Vision? N N  Difficulty concentrating or making decisions? Y N  Walking or climbing stairs? Y N  Dressing or bathing? N N  Doing errands, shopping? N N    Fall Risk Assessment Fall Risk  11/15/2015 05/24/2015  Falls in the past year? No No      Depression Screen PHQ 2/9 Scores 11/15/2015 05/24/2015  PHQ - 2 Score 0 0    Cognitive Testing - 6-CIT   Correct? Score   What year is it? yes 0 Yes = 0    No = 4  What month is it? yes 0 Yes = 0    No = 3  Remember:     [address]ia [address], [place], [place]     What time is it? yes 0 Yes = 0    No = 3  Count backwards from 20 to 1 yes 0 Correct = 0    1 error = 2   More than 1 error = 4  Say the months of the year in reverse. yes  0 Correct = 0    1 error = 2   More than 1 error = 4  What address did I ask you to remember? yes 0 Correct = 0  1 error = 2    2 error = 4    3 error = 6    4 error = 8    All wrong = 10       TOTAL SCORE  0/28   Interpretation:  Normal  Normal (0-7) Abnormal (8-28)   Lab Results  Component Value Date   HGBA1C 6.8* 05/24/2015    Lab Results  Component Value Date   CHOL 231* 11/13/2014   HDL 67 11/13/2014   LDLCALC 138 11/13/2014   TRIG 132 11/13/2014    Lab Results  Component Value Date   CREATININE 0.66 05/24/2015      Medicare Annual Wellness Visit Summary:  Reviewed patient's Family Medical History Reviewed and updated list of patient's medical providers Assessment of cognitive impairment was done Assessed patient's functional ability Established a written schedule for health screening [place] Completed and Reviewed  Exercise Activities and Dietary  recommendations Goals    None      Immunization History  Administered Date(s) Administered  . Pneumococcal Conjugate-13 11/13/2014  . Pneumococcal Polysaccharide-23 11/24/2006  . Zoster 01/17/2015    Health Maintenance  Topic Date Due  . DEXA SCAN  12/14/2006  . PNA vac Low Risk Adult (2 of 2 - PPSV23) 11/14/2015  . TETANUS/TDAP  11/25/2018 (Originally 12/14/1960)  . INFLUENZA VACCINE  10/25/2019 (Originally 06/25/2015)  . MAMMOGRAM  10/16/2017  . COLONOSCOPY  06/13/2025  . ZOSTAVAX  Completed     Discussed health benefits of physical activity, and encouraged her to engage in regular exercise appropriate for her age and condition.    ------------------------------------------------------------------------------------------------------------   Assessment & Plan:  1. Medicare annual wellness visit, subsequent Patient declines flu and tetanus vaccines - POCT urinalysis dipstick  2. Essential (primary) hypertension controlled - CBC with Differential/Platelet - Comprehensive metabolic panel - TSH - lisinopril (PRINIVIL,ZESTRIL) 20 MG tablet; Take 1 tablet (20 mg total) by mouth daily.  Dispense: 90 tablet; Refill: 3  3. Dyslipidemia On statin therapy without side effects - Lipid panel - atorvastatin (LIPITOR) 10 MG tablet; Take 1 tablet (10 mg total) by mouth at bedtime.  Dispense: 90 tablet; Refill: 3  4. Carbohydrate metabolic effects (HCC) Borderline elevated A1C - continue low carb diet If A1C is higher, will consider medication and close followup - Hemoglobin A1c  5. Chronic left hip pain On mobic as needed; followed by Orthopedics  6. Hx of malignant melanoma of skin Will see Dermatology regularly  7. Urge incontinence Controlled with Toviaz daily   Halina Maidens, MD [facility]  11/15/2015

## 2015-11-16 LAB — CBC WITH DIFFERENTIAL/PLATELET
Basophils Absolute: 0 10*3/uL (ref 0.0–0.2)
Basos: 1 %
EOS (ABSOLUTE): 0.1 10*3/uL (ref 0.0–0.4)
Eos: 2 %
Hematocrit: 41.5 % (ref 34.0–46.6)
Hemoglobin: 14.2 g/dL (ref 11.1–15.9)
IMMATURE GRANULOCYTES: 0 %
Immature Grans (Abs): 0 10*3/uL (ref 0.0–0.1)
Lymphocytes Absolute: 1.9 10*3/uL (ref 0.7–3.1)
Lymphs: 34 %
MCH: 30 pg (ref 26.6–33.0)
MCHC: 34.2 g/dL (ref 31.5–35.7)
MCV: 88 fL (ref 79–97)
MONOS ABS: 0.4 10*3/uL (ref 0.1–0.9)
Monocytes: 6 %
NEUTROS PCT: 57 %
Neutrophils Absolute: 3.2 10*3/uL (ref 1.4–7.0)
Platelets: 311 10*3/uL (ref 150–379)
RBC: 4.74 x10E6/uL (ref 3.77–5.28)
RDW: 13.4 % (ref 12.3–15.4)
WBC: 5.5 10*3/uL (ref 3.4–10.8)

## 2015-11-16 LAB — COMPREHENSIVE METABOLIC PANEL
A/G RATIO: 1.5 (ref 1.1–2.5)
ALT: 20 IU/L (ref 0–32)
AST: 21 IU/L (ref 0–40)
Albumin: 4.3 g/dL (ref 3.5–4.8)
Alkaline Phosphatase: 67 IU/L (ref 39–117)
BUN/Creatinine Ratio: 22 (ref 11–26)
BUN: 17 mg/dL (ref 8–27)
Bilirubin Total: 0.5 mg/dL (ref 0.0–1.2)
CALCIUM: 9.5 mg/dL (ref 8.7–10.3)
CO2: 25 mmol/L (ref 18–29)
Chloride: 101 mmol/L (ref 96–106)
Creatinine, Ser: 0.76 mg/dL (ref 0.57–1.00)
GFR calc Af Amer: 90 mL/min/{1.73_m2} (ref >59)
GFR, EST NON AFRICAN AMERICAN: 78 mL/min/{1.73_m2} (ref >59)
Globulin, Total: 2.8 g/dL (ref 1.5–4.5)
Glucose: 132 mg/dL — ABNORMAL HIGH (ref 65–99)
POTASSIUM: 4.5 mmol/L (ref 3.5–5.2)
Sodium: 140 mmol/L (ref 134–144)
TOTAL PROTEIN: 7.1 g/dL (ref 6.0–8.5)

## 2015-11-16 LAB — HEMOGLOBIN A1C
Est. average glucose Bld gHb Est-mCnc: 148 mg/dL
Hgb A1c MFr Bld: 6.8 % — ABNORMAL HIGH (ref 4.8–5.6)

## 2015-11-16 LAB — LIPID PANEL
CHOL/HDL RATIO: 3 ratio (ref 0.0–4.4)
Cholesterol, Total: 183 mg/dL (ref 100–199)
HDL: 62 mg/dL (ref >39)
LDL Calculated: 99 mg/dL (ref 0–99)
TRIGLYCERIDES: 109 mg/dL (ref 0–149)
VLDL CHOLESTEROL CAL: 22 mg/dL (ref 5–40)

## 2015-11-16 LAB — TSH: TSH: 2.51 u[IU]/mL (ref 0.450–4.500)

## 2015-11-23 ENCOUNTER — Ambulatory Visit: Payer: Medicare Other | Admitting: Internal Medicine

## 2016-01-21 ENCOUNTER — Ambulatory Visit: Admission: RE | Admit: 2016-01-21 | Payer: Medicare Other | Source: Ambulatory Visit | Admitting: Ophthalmology

## 2016-01-21 ENCOUNTER — Encounter: Admission: RE | Payer: Self-pay | Source: Ambulatory Visit

## 2016-01-21 SURGERY — PHACOEMULSIFICATION, CATARACT, WITH IOL INSERTION
Anesthesia: Topical | Laterality: Right

## 2016-02-08 ENCOUNTER — Other Ambulatory Visit: Payer: Self-pay | Admitting: Internal Medicine

## 2016-06-26 ENCOUNTER — Other Ambulatory Visit: Payer: Self-pay | Admitting: Obstetrics and Gynecology

## 2016-06-26 DIAGNOSIS — Z1231 Encounter for screening mammogram for malignant neoplasm of breast: Secondary | ICD-10-CM

## 2016-10-20 ENCOUNTER — Ambulatory Visit: Admission: RE | Admit: 2016-10-20 | Payer: Medicare Other | Source: Ambulatory Visit

## 2016-10-28 ENCOUNTER — Ambulatory Visit
Admission: RE | Admit: 2016-10-28 | Discharge: 2016-10-28 | Disposition: A | Discharge status: Home / Self Care | Payer: Medicare Other | Source: Ambulatory Visit | Attending: Obstetrics and Gynecology | Admitting: Obstetrics and Gynecology

## 2016-10-28 DIAGNOSIS — Z1231 Encounter for screening mammogram for malignant neoplasm of breast: Secondary | ICD-10-CM | POA: Diagnosis not present

## 2016-11-18 ENCOUNTER — Encounter: Payer: Self-pay | Admitting: Internal Medicine

## 2016-11-19 ENCOUNTER — Other Ambulatory Visit: Payer: Self-pay | Admitting: Internal Medicine

## 2016-11-19 ENCOUNTER — Encounter: Payer: Self-pay | Admitting: Internal Medicine

## 2016-11-19 ENCOUNTER — Ambulatory Visit (INDEPENDENT_AMBULATORY_CARE_PROVIDER_SITE_OTHER): Payer: Medicare Other | Admitting: Internal Medicine

## 2016-11-19 VITALS — BP 142/86 | HR 78 | Temp 98.6°F | Ht 62.0 in | Wt 171.0 lb

## 2016-11-19 DIAGNOSIS — E118 Type 2 diabetes mellitus with unspecified complications: Secondary | ICD-10-CM | POA: Insufficient documentation

## 2016-11-19 DIAGNOSIS — E119 Type 2 diabetes mellitus without complications: Secondary | ICD-10-CM

## 2016-11-19 DIAGNOSIS — N3941 Urge incontinence: Secondary | ICD-10-CM | POA: Diagnosis not present

## 2016-11-19 DIAGNOSIS — I10 Essential (primary) hypertension: Secondary | ICD-10-CM | POA: Diagnosis not present

## 2016-11-19 DIAGNOSIS — Z0000 Encounter for general adult medical examination without abnormal findings: Secondary | ICD-10-CM

## 2016-11-19 DIAGNOSIS — E785 Hyperlipidemia, unspecified: Secondary | ICD-10-CM | POA: Diagnosis not present

## 2016-11-19 DIAGNOSIS — Z0001 Encounter for general adult medical examination with abnormal findings: Secondary | ICD-10-CM | POA: Diagnosis not present

## 2016-11-19 LAB — POCT URINALYSIS DIPSTICK
BILIRUBIN UA: NEGATIVE
GLUCOSE UA: NEGATIVE
Leukocytes, UA: NEGATIVE
NITRITE UA: NEGATIVE
Protein, UA: NEGATIVE
Urobilinogen, UA: 0.2
pH, UA: 5

## 2016-11-19 MED ORDER — ASPIRIN EC 81 MG PO TBEC: 81.0000 mg | DELAYED_RELEASE_TABLET | Freq: Every day | ORAL | 3 refills | Status: AC

## 2016-11-19 NOTE — Progress Notes (Signed)
Patient: Melody Jones, Female    DOB: 1941/05/20, 75 y.o.   MRN: [id number] Visit Date: 11/19/2016  Today's Provider: Halina Maidens, MD   Chief Complaint  Patient presents with  . Medicare Wellness   Subjective:    Annual wellness visit Melody Jones is a 75 y.o. female who presents today for her Subsequent Annual Wellness Visit. She feels well. She reports exercising walking. She reports she is sleeping fairly well.   She recently had her mammogram. ----------------------------------------------------------- Hypertension  This is a chronic problem. The current episode started more than 1 year ago. The problem is unchanged. Pertinent negatives include no chest pain, headaches, palpitations or shortness of breath.  Hyperlipidemia  This is a chronic problem. The problem is controlled. Recent lipid tests were reviewed and are normal. Pertinent negatives include no chest pain or shortness of breath. Current antihyperlipidemic treatment includes statins.  Diabetes  She presents for her follow-up diabetic visit. She has type 2 diabetes mellitus. The initial diagnosis of diabetes was made 1 year ago. Pertinent negatives for hypoglycemia include no dizziness, headaches, nervousness/anxiousness or tremors. Pertinent negatives for diabetes include no chest pain, no fatigue, no polydipsia and no polyuria.  She is following a generally healthy diet but does not have a glucometer.  She would consider getting one if BS is worse.  Review of Systems  Constitutional: Negative for chills, fatigue and fever.  HENT: Negative for congestion, hearing loss, tinnitus, trouble swallowing and voice change.   Eyes: Negative for visual disturbance.  Respiratory: Negative for cough, chest tightness, shortness of breath and wheezing.   Cardiovascular: Negative for chest pain, palpitations and leg swelling.  Gastrointestinal: Negative for abdominal pain, constipation, diarrhea and vomiting.  Endocrine:  Negative for polydipsia and polyuria.  Genitourinary: Negative for dysuria, frequency, genital sores, hematuria, vaginal bleeding and vaginal discharge.  Musculoskeletal: Positive for arthralgias. Negative for gait problem and joint swelling.  Skin: Negative for color change and rash.  Neurological: Negative for dizziness, tremors, light-headedness and headaches.  Hematological: Negative for adenopathy. Does not bruise/bleed easily.  Psychiatric/Behavioral: Negative for dysphoric mood and sleep disturbance. The patient is not nervous/anxious.     Social History   Social History  . Marital status: Married    Spouse name: N/A  . Number of children: N/A  . Years of education: N/A   Occupational History  . Not on file.   Social History Main Topics  . Smoking status: Former Research scientist (life sciences)  . Smokeless tobacco: Never Used  . Alcohol use 0.0 oz/week  . Drug use: No  . Sexual activity: Not Currently   Other Topics Concern  . Not on file   Social History Narrative  . No narrative on file    Patient Active Problem List   Diagnosis Date Noted  . Type 2 diabetes mellitus without complication, without long-term current use of insulin ([place]) 11/19/2016  . Malignant melanoma of shoulder, left ([place]) 06/05/2015  . Chronic left hip pain 05/24/2015  . Dyslipidemia 03/15/2015  . Essential (primary) hypertension 03/15/2015  . OP (osteoporosis) 03/15/2015  . Allergic rhinitis, seasonal 03/15/2015  . Urge incontinence 03/15/2015  . Avitaminosis D 03/15/2015    Past Surgical History:  Procedure Laterality Date  . BUNIONECTOMY  2013  . COLONOSCOPY N/A 06/14/2015   Procedure: COLONOSCOPY;  Surgeon: Manya Silvas, MD;  Location: [facility] ENDOSCOPY;  Service: Endoscopy;  Laterality: N/A;  . MELANOMA EXCISION Left Shoulder  . TUBAL LIGATION      Her family history includes  Alcohol abuse in her mother; Stroke in her father.     Previous Medications   ATORVASTATIN (LIPITOR) 10 MG TABLET    TAKE 1  TABLET AT BEDTIME   CHOLECALCIFEROL (VITAMIN D-1000 MAX ST) 1000 UNITS TABLET    Take 1 tablet by mouth daily.   FESOTERODINE (TOVIAZ) 8 MG TB24 TABLET    Take 1 tablet by mouth daily.   LISINOPRIL (PRINIVIL,ZESTRIL) 20 MG TABLET    Take 1 tablet (20 mg total) by mouth daily.   MELOXICAM (MOBIC) 15 MG TABLET    Take 15 mg by mouth daily as needed for pain.    Patient Care Team: Glean Hess, MD as PCP - General (Family Medicine) Christophe Louis, MD as Referring Physician (Orthopedic Surgery) Boykin Nearing, MD as Referring Physician (Obstetrics and Gynecology) Babs Sciara, MD (Surgical Oncology) Manya Silvas, MD (Gastroenterology)      Objective:   Vitals: BP (!) 142/86   Pulse 78   Temp 98.6 F (37 C)   Ht 5\' 2"  (1.575 m)   Wt 171 lb (77.6 kg)   SpO2 96%   BMI 31.28 kg/m   Physical Exam  Constitutional: She is oriented to person, place, and time. She appears well-developed and well-nourished. No distress.  HENT:  Head: Normocephalic and atraumatic.  Right Ear: Tympanic membrane and ear canal normal.  Left Ear: Tympanic membrane and ear canal normal.  Nose: Right sinus exhibits no maxillary sinus tenderness. Left sinus exhibits no maxillary sinus tenderness.  Mouth/Throat: Uvula is midline and oropharynx is clear and moist.  Eyes: Conjunctivae and EOM are normal. Right eye exhibits no discharge. Left eye exhibits no discharge. No scleral icterus.  Neck: Normal range of motion. Carotid bruit is not present. No erythema present. No thyromegaly present.  Cardiovascular: Normal rate, regular rhythm, normal heart sounds and normal pulses.   Pulmonary/Chest: Effort normal. No respiratory distress. She has no wheezes. Right breast exhibits no mass, no nipple discharge, no skin change and no tenderness. Left breast exhibits no mass, no nipple discharge, no skin change and no tenderness.  Abdominal: Soft. Bowel sounds are normal. There is no hepatosplenomegaly. There  is no tenderness. There is no CVA tenderness.  Musculoskeletal: Normal range of motion.  Lymphadenopathy:    She has no cervical adenopathy.    She has no axillary adenopathy.  Neurological: She is alert and oriented to person, place, and time. She has normal reflexes. No cranial nerve deficit or sensory deficit.  Skin: Skin is warm, dry and intact. No rash noted.  Psychiatric: She has a normal mood and affect. Her speech is normal and behavior is normal. Thought content normal.  Nursing note and vitals reviewed.   Activities of Daily Living In your present state of health, do you have any difficulty performing the following activities: 11/19/2016  Hearing? N  Vision? Y  Difficulty concentrating or making decisions? N  Walking or climbing stairs? N  Dressing or bathing? N  Doing errands, shopping? N  Preparing Food and eating ? Y  Using the Toilet? Y  In the past six months, have you accidently leaked urine? Y  Do you have problems with loss of bowel control? N  Managing your Medications? Y  Managing your Finances? Y  Housekeeping or managing your Housekeeping? Y  Some recent data might be hidden    Fall Risk Assessment Fall Risk  11/19/2016 11/15/2015 05/24/2015  Falls in the past year? No No No  Depression Screen PHQ 2/9 Scores 11/19/2016 11/15/2015 05/24/2015  PHQ - 2 Score 0 0 0    6CIT Screen 11/19/2016  What Year? 0 points  What month? 0 points  What time? 0 points  Count back from 20 0 points  Months in reverse 0 points  Repeat phrase 0 points  Total Score 0     Medicare Annual Wellness Visit Summary:  Reviewed patient's Family Medical History Reviewed and updated list of patient's medical providers Assessment of cognitive impairment was done Assessed patient's functional ability Established a written schedule for health screening [place] Completed and Reviewed  Exercise Activities and Dietary recommendations Goals    .  Weight (lb) < 200 lb (90.7 kg)          Stay healthty       Immunization History  Administered Date(s) Administered  . Pneumococcal Conjugate-13 11/13/2014  . Pneumococcal Polysaccharide-23 11/24/2006, 12/18/2006  . Zoster 01/17/2015    Health Maintenance  Topic Date Due  . FOOT EXAM  12/15/1951  . OPHTHALMOLOGY EXAM  12/15/1950  . DEXA SCAN  12/14/2006  . HEMOGLOBIN A1C  05/15/2016  . TETANUS/TDAP  11/25/2018 (Originally 12/14/1960)  . INFLUENZA VACCINE  10/25/2019 (Originally 06/24/2016)  . MAMMOGRAM  10/28/2018  . COLONOSCOPY  06/13/2025  . ZOSTAVAX  Completed  . PNA vac Low Risk Adult  Completed    Discussed health benefits of physical activity, and encouraged her to engage in regular exercise appropriate for her age and condition.    ------------------------------------------------------------------------------------------------------------  Assessment & Plan:    1. Medicare annual wellness visit, subsequent Measures satisfied Flu vaccine declined - POCT urinalysis dipstick  2. Essential (primary) hypertension controlled - CBC with Differential/Platelet - Comprehensive metabolic panel - TSH  3. Type 2 diabetes mellitus without complication, without long-term current use of insulin (HCC) Continue diet; will advise if medication is needed Will hold off on glucometer at home for now Begin Aspirin 81 mg - Hemoglobin A1c - Microalbumin / creatinine urine ratio  4. Dyslipidemia On statin therapy - Lipid panel  5. Urge incontinence Doing well on [place], MD [facility]  11/19/2016

## 2016-11-19 NOTE — Patient Instructions (Addendum)
Health Maintenance  Topic Date Due  . DEXA SCAN  12/14/2006  . TETANUS/TDAP  11/25/2018 (Originally 12/14/1960)  . INFLUENZA VACCINE  10/25/2019 (Originally 06/24/2016)  . OPHTHALMOLOGY EXAM  05/08/2017  . HEMOGLOBIN A1C  05/20/2017  . FOOT EXAM  11/19/2017  . MAMMOGRAM  10/28/2018  . COLONOSCOPY  06/13/2025  . ZOSTAVAX  Completed  . PNA vac Low Risk Adult  Completed    Begin Aspirin 81 mg once a day.

## 2016-11-20 LAB — COMPREHENSIVE METABOLIC PANEL
ALT: 24 IU/L (ref 0–32)
AST: 23 IU/L (ref 0–40)
Albumin/Globulin Ratio: 1.6 (ref 1.2–2.2)
Albumin: 4.4 g/dL (ref 3.5–4.8)
Alkaline Phosphatase: 63 IU/L (ref 39–117)
BILIRUBIN TOTAL: 0.5 mg/dL (ref 0.0–1.2)
BUN/Creatinine Ratio: 20 (ref 12–28)
BUN: 13 mg/dL (ref 8–27)
CHLORIDE: 102 mmol/L (ref 96–106)
CO2: 25 mmol/L (ref 18–29)
Calcium: 9.1 mg/dL (ref 8.7–10.3)
Creatinine, Ser: 0.65 mg/dL (ref 0.57–1.00)
GFR calc Af Amer: 101 mL/min/{1.73_m2} (ref >59)
GFR calc non Af Amer: 88 mL/min/{1.73_m2} (ref >59)
GLUCOSE: 137 mg/dL — AB (ref 65–99)
Globulin, Total: 2.8 g/dL (ref 1.5–4.5)
POTASSIUM: 4 mmol/L (ref 3.5–5.2)
Sodium: 142 mmol/L (ref 134–144)
TOTAL PROTEIN: 7.2 g/dL (ref 6.0–8.5)

## 2016-11-20 LAB — MICROALBUMIN / CREATININE URINE RATIO
CREATININE, UR: 225.6 mg/dL
MICROALBUM., U, RANDOM: 24.8 ug/mL
Microalb/Creat Ratio: 11 mg/g creat (ref 0.0–30.0)

## 2016-11-20 LAB — CBC WITH DIFFERENTIAL/PLATELET
BASOS ABS: 0 10*3/uL (ref 0.0–0.2)
Basos: 1 %
EOS (ABSOLUTE): 0.2 10*3/uL (ref 0.0–0.4)
Eos: 3 %
Hematocrit: 41.5 % (ref 34.0–46.6)
Hemoglobin: 14 g/dL (ref 11.1–15.9)
IMMATURE GRANS (ABS): 0 10*3/uL (ref 0.0–0.1)
Immature Granulocytes: 0 %
LYMPHS: 34 %
Lymphocytes Absolute: 1.8 10*3/uL (ref 0.7–3.1)
MCH: 29.7 pg (ref 26.6–33.0)
MCHC: 33.7 g/dL (ref 31.5–35.7)
MCV: 88 fL (ref 79–97)
Monocytes Absolute: 0.4 10*3/uL (ref 0.1–0.9)
Monocytes: 7 %
Neutrophils Absolute: 2.9 10*3/uL (ref 1.4–7.0)
Neutrophils: 55 %
Platelets: 277 10*3/uL (ref 150–379)
RBC: 4.71 x10E6/uL (ref 3.77–5.28)
RDW: 13.9 % (ref 12.3–15.4)
WBC: 5.3 10*3/uL (ref 3.4–10.8)

## 2016-11-20 LAB — LIPID PANEL
CHOL/HDL RATIO: 2.5 ratio (ref 0.0–4.4)
CHOLESTEROL TOTAL: 157 mg/dL (ref 100–199)
HDL: 62 mg/dL (ref >39)
LDL CALC: 75 mg/dL (ref 0–99)
TRIGLYCERIDES: 100 mg/dL (ref 0–149)
VLDL CHOLESTEROL CAL: 20 mg/dL (ref 5–40)

## 2016-11-20 LAB — HEMOGLOBIN A1C
ESTIMATED AVERAGE GLUCOSE: 148 mg/dL
Hgb A1c MFr Bld: 6.8 % — ABNORMAL HIGH (ref 4.8–5.6)

## 2016-11-20 LAB — TSH: TSH: 2.49 u[IU]/mL (ref 0.450–4.500)

## 2017-02-04 ENCOUNTER — Other Ambulatory Visit: Payer: Self-pay | Admitting: Internal Medicine

## 2017-02-04 DIAGNOSIS — I10 Essential (primary) hypertension: Secondary | ICD-10-CM

## 2017-02-16 ENCOUNTER — Other Ambulatory Visit: Payer: Self-pay | Admitting: Internal Medicine

## 2017-05-20 ENCOUNTER — Ambulatory Visit: Payer: Medicare Other | Admitting: Internal Medicine

## 2017-06-01 LAB — HM DIABETES EYE EXAM

## 2017-06-02 ENCOUNTER — Ambulatory Visit (INDEPENDENT_AMBULATORY_CARE_PROVIDER_SITE_OTHER): Payer: Medicare Other | Admitting: Internal Medicine

## 2017-06-02 ENCOUNTER — Encounter: Payer: Self-pay | Admitting: Internal Medicine

## 2017-06-02 ENCOUNTER — Other Ambulatory Visit: Payer: Self-pay | Admitting: Internal Medicine

## 2017-06-02 VITALS — BP 130/82 | HR 77 | Ht 62.0 in | Wt 174.0 lb

## 2017-06-02 DIAGNOSIS — Z85820 Personal history of malignant melanoma of skin: Secondary | ICD-10-CM

## 2017-06-02 DIAGNOSIS — E119 Type 2 diabetes mellitus without complications: Secondary | ICD-10-CM

## 2017-06-02 DIAGNOSIS — M1991 Primary osteoarthritis, unspecified site: Secondary | ICD-10-CM | POA: Insufficient documentation

## 2017-06-02 DIAGNOSIS — I10 Essential (primary) hypertension: Secondary | ICD-10-CM | POA: Diagnosis not present

## 2017-06-02 NOTE — Progress Notes (Signed)
Date:  06/02/2017   Name:  Melody Jones   DOB:  [birth year]   MRN:  [id number]   Chief Complaint: Diabetes Diabetes  She presents for her follow-up diabetic visit. She has type 2 diabetes mellitus. Her disease course has been stable. Pertinent negatives for hypoglycemia include no headaches or tremors. Pertinent negatives for diabetes include no chest pain, no fatigue, no polydipsia and no polyuria. Symptoms are stable. Current diabetic treatment includes diet (and does not check BS). Her weight is stable. She is following a diabetic diet. An ACE inhibitor/angiotensin II receptor blocker is being taken. Eye exam is current.  Hypertension  This is a chronic problem. The problem is controlled. Pertinent negatives include no chest pain, headaches, palpitations or shortness of breath. Risk factors for coronary artery disease include diabetes mellitus. Past treatments include ACE inhibitors. The current treatment provides significant improvement.  Hx of Melanoma - 2016 followed by Dermatology and Oncology with no recurrence.  Lab Results  Component Value Date   HGBA1C 6.8 (H) 11/19/2016     Review of Systems  Constitutional: Negative for appetite change, fatigue, fever and unexpected weight change.  HENT: Negative for tinnitus and trouble swallowing.   Eyes: Positive for visual disturbance (unrelated to DM).  Respiratory: Negative for cough, chest tightness and shortness of breath.   Cardiovascular: Negative for chest pain, palpitations and leg swelling.  Gastrointestinal: Negative for abdominal pain.  Endocrine: Negative for polydipsia and polyuria.  Genitourinary: Negative for dysuria and hematuria.  Musculoskeletal: Positive for myalgias (intermittent left hip catching). Negative for arthralgias.  Neurological: Negative for tremors, numbness and headaches.  Psychiatric/Behavioral: Negative for dysphoric mood and sleep disturbance.    Patient Active Problem List   Diagnosis Date  Noted  . Localized, primary osteoarthritis 06/02/2017  . Type 2 diabetes mellitus without complication, without long-term current use of insulin ([place]) 11/19/2016  . Malignant melanoma of shoulder, left ([place]) 06/05/2015  . Chronic left hip pain 05/24/2015  . Dyslipidemia 03/15/2015  . Essential (primary) hypertension 03/15/2015  . OP (osteoporosis) 03/15/2015  . Allergic rhinitis, seasonal 03/15/2015  . Urge incontinence 03/15/2015  . Avitaminosis D 03/15/2015    Prior to Admission medications   Medication Sig Start Date End Date Taking? Authorizing Provider      Yes [provider]  aspirin EC 81 MG tablet Take 1 tablet (81 mg total) by mouth daily. 11/19/16  Yes Glean Hess, MD  atorvastatin (LIPITOR) 10 MG tablet TAKE 1 TABLET AT BEDTIME 02/16/17  Yes Glean Hess, MD  Cholecalciferol (VITAMIN D-1000 MAX ST) 1000 UNITS tablet Take 1 tablet by mouth daily.   Yes [provider]  Ezetimibe (ZETIA PO) Zetia   Yes [provider]  fesoterodine (TOVIAZ) 8 MG TB24 tablet Take 1 tablet by mouth daily.   Yes [provider]  lisinopril (PRINIVIL,ZESTRIL) 20 MG tablet TAKE 1 TABLET DAILY 02/04/17  Yes Glean Hess, MD  meloxicam (MOBIC) 15 MG tablet Take 15 mg by mouth daily as needed for pain.   Yes [provider]    Allergies  Allergen Reactions  . Latex Swelling    Past Surgical History:  Procedure Laterality Date  . BUNIONECTOMY  2013  . COLONOSCOPY N/A 06/14/2015   Procedure: COLONOSCOPY;  Surgeon: Manya Silvas, MD;  Location: [facility] ENDOSCOPY;  Service: Endoscopy;  Laterality: N/A;  . MELANOMA EXCISION Left Shoulder  . TUBAL LIGATION      Social History  Substance Use Topics  .  Smoking status: Former Research scientist (life sciences)  . Smokeless tobacco: Never Used  . Alcohol use 0.0 oz/week     Medication list has been reviewed and updated.   Physical Exam  Constitutional: She is oriented to person, place, and time. She appears  well-developed. No distress.  HENT:  Head: Normocephalic and atraumatic.  Neck: Normal range of motion. Neck supple. No thyromegaly present.  Cardiovascular: Normal rate, regular rhythm and normal heart sounds.   Pulmonary/Chest: Effort normal and breath sounds normal. No respiratory distress. She has no wheezes.  Musculoskeletal:       Right hip: She exhibits decreased range of motion. She exhibits no tenderness.       Left hip: She exhibits decreased range of motion. She exhibits no tenderness and no bony tenderness.  Neurological: She is alert and oriented to person, place, and time. No sensory deficit.  Skin: Skin is warm and dry. No rash noted.  Psychiatric: She has a normal mood and affect. Her behavior is normal. Thought content normal.  Nursing note and vitals reviewed.   BP 130/82   Pulse 77   Ht 5\' 2"  (1.575 m)   Wt 174 lb (78.9 kg)   SpO2 96%   BMI 31.83 kg/m   Assessment and Plan: 1. Type 2 diabetes mellitus without complication, without long-term current use of insulin (HCC) Continue diet and exercise - Hemoglobin A1c  2. Essential (primary) hypertension controlled  3. History of malignant melanoma No recurrence; followed by Oncology   No orders of the defined types were placed in this encounter.   Halina Maidens, MD [facility]  06/02/2017

## 2017-06-03 LAB — HEMOGLOBIN A1C
Est. average glucose Bld gHb Est-mCnc: 151 mg/dL
HEMOGLOBIN A1C: 6.9 % — AB (ref 4.8–5.6)

## 2017-07-02 ENCOUNTER — Other Ambulatory Visit: Payer: Self-pay | Admitting: Obstetrics and Gynecology

## 2017-07-02 DIAGNOSIS — Z1231 Encounter for screening mammogram for malignant neoplasm of breast: Secondary | ICD-10-CM

## 2017-10-29 ENCOUNTER — Ambulatory Visit: Payer: Medicare Other

## 2017-11-02 ENCOUNTER — Ambulatory Visit: Payer: Medicare Other

## 2017-11-10 ENCOUNTER — Ambulatory Visit
Admission: RE | Admit: 2017-11-10 | Discharge: 2017-11-10 | Disposition: A | Discharge status: Home / Self Care | Payer: Medicare Other | Source: Ambulatory Visit | Attending: Internal Medicine | Admitting: Internal Medicine

## 2017-11-10 DIAGNOSIS — Z1231 Encounter for screening mammogram for malignant neoplasm of breast: Secondary | ICD-10-CM | POA: Insufficient documentation

## 2017-11-19 ENCOUNTER — Ambulatory Visit (INDEPENDENT_AMBULATORY_CARE_PROVIDER_SITE_OTHER): Payer: Medicare Other

## 2017-11-19 VITALS — BP 110/70 | HR 74 | Temp 97.7°F | Resp 16 | Ht 62.0 in | Wt 175.6 lb

## 2017-11-19 DIAGNOSIS — Z0000 Encounter for general adult medical examination without abnormal findings: Secondary | ICD-10-CM

## 2017-11-19 NOTE — Progress Notes (Signed)
Subjective:   Melody Jones is a 76 y.o. female who presents for Medicare Annual (Subsequent) preventive examination.  Review of Systems:  N/A Cardiac Risk Factors include: advanced age (>52men, >71 women);diabetes mellitus;dyslipidemia;hypertension;obesity (BMI >30kg/m2);sedentary lifestyle     Objective:     Vitals: BP 110/70 (BP Location: Right Arm, Patient Position: Sitting, Cuff Size: Normal)   Pulse 74   Temp 97.7 F (36.5 C) (Oral)   Resp 16   Ht 5\' 2"  (1.575 m)   Wt 175 lb 9.6 oz (79.7 kg)   BMI 32.12 kg/m   Body mass index is 32.12 kg/m.  Advanced Directives 11/19/2017 11/19/2016 11/15/2015 06/14/2015  Does Patient Have a Medical Advance Directive? Yes No;Yes Yes No  Type of Paramedic of [place];Living will Living will Melody Jones;Living will -  Copy of [place] in Chart? No - copy requested - No - copy requested -    Tobacco Social History   Tobacco Use  Smoking Status Former Smoker  . Packs/day: 0.25  . Years: 20.00  . Pack years: 5.00  . Types: Cigarettes  . Last attempt to quit: 1988  . Years since quitting: 31.0  Smokeless Tobacco Never Used  Tobacco Comment   Smoking cessation materials not required     Counseling given: No Comment: Smoking cessation materials not required   Clinical Intake:  Pre-visit preparation completed: Yes  Pain : No/denies pain  BMI - recorded: 32.12 Nutritional Status: BMI > 30  Obese Nutritional Risks: None Diabetes: Yes CBG done?: No Did pt. bring in CBG monitor from home?: No  How often do you need to have someone help you when you read instructions, pamphlets, or other written materials from your doctor or pharmacy?: 1 - Never  Interpreter Needed?: No  Information entered by :: AEversole, LPN  Past Medical History:  Diagnosis Date  . Cancer ([place])    Melanoma LT Shoulder  . Hyperlipidemia   . Hypertension    Past Surgical History:    Procedure Laterality Date  . BUNIONECTOMY  2013  . COLONOSCOPY N/A 06/14/2015   Procedure: COLONOSCOPY;  Surgeon: Manya Silvas, MD;  Location: [facility] ENDOSCOPY;  Service: Endoscopy;  Laterality: N/A;  . MELANOMA EXCISION Left Shoulder  . TUBAL LIGATION     Family History  Problem Relation Age of Onset  . Alcohol abuse Mother   . Stroke Father   . Cancer Father   . Breast cancer Neg Hx    Social History   Socioeconomic History  . Marital status: Married    Spouse name: None  . Number of children: 3  . Years of education: None  . Highest education level: Some college, no degree  Social Needs  . Financial resource strain: Not hard at all  . Food insecurity - worry: Never true  . Food insecurity - inability: Never true  . Transportation needs - medical: No  . Transportation needs - non-medical: No  Occupational History  . Occupation: Retired  Tobacco Use  . Smoking status: Former Smoker    Packs/day: 0.25    Years: 20.00    Pack years: 5.00    Types: Cigarettes    Last attempt to quit: 1988    Years since quitting: 31.0  . Smokeless tobacco: Never Used  . Tobacco comment: Smoking cessation materials not required  Substance and Sexual Activity  . Alcohol use: No    Alcohol/week: 0.0 oz    Frequency: Never  .  Drug use: No  . Sexual activity: Not Currently  Other Topics Concern  . None  Social History Narrative  . None    Outpatient Encounter Medications as of 11/19/2017  Medication Sig  . aspirin EC 81 MG tablet Take 1 tablet (81 mg total) by mouth daily.  Marland Kitchen atorvastatin (LIPITOR) 10 MG tablet TAKE 1 TABLET AT BEDTIME  . Cholecalciferol (VITAMIN D-1000 MAX ST) 1000 UNITS tablet Take 1 tablet by mouth daily.  . fesoterodine (TOVIAZ) 8 MG TB24 tablet Take 1 tablet by mouth daily.  Marland Kitchen lisinopril (PRINIVIL,ZESTRIL) 20 MG tablet TAKE 1 TABLET DAILY  . meloxicam (MOBIC) 15 MG tablet Take 15 mg by mouth daily as needed for pain.  Marland Kitchen alendronate (FOSAMAX) 70 MG tablet  alendronate 70 mg tablet  . Ezetimibe (ZETIA PO) Zetia   No facility-administered encounter medications on file as of 11/19/2017.     Activities of Daily Living In your present state of health, do you have any difficulty performing the following activities: 11/19/2017 11/19/2016  Hearing? N N  Comment denies use of hearing aids -  Vision? N Y  Comment wears eye glasses -  Difficulty concentrating or making decisions? Y N  Comment short term memory loss -  Walking or climbing stairs? N N  Dressing or bathing? N N  Doing errands, shopping? N N  Preparing Food and eating ? N Y  Comment upper partial dentures -  Using the Toilet? N Y  In the past six months, have you accidently leaked urine? N Y  Comment - occasional  Do you have problems with loss of bowel control? N N  Managing your Medications? N Y  Managing your Finances? N Y  Housekeeping or managing your Housekeeping? N Y  Some recent data might be hidden    Patient Care Team: Glean Hess, MD as PCP - General (Family Medicine) Schermerhorn, Gwen Her, MD as Consulting Physician (Obstetrics and Gynecology) Manya Silvas, MD (Gastroenterology)    Assessment:   This is a routine wellness examination for Melody Jones.  Exercise Activities and Dietary recommendations Current Exercise Habits: The patient does not participate in regular exercise at present, Exercise limited by: None identified  Goals    . DIET - INCREASE WATER INTAKE     Recommend to drink at least 6-8 8oz glasses of water per day    . Weight (lb) < 200 lb (90.7 kg)     Stay healthty       Fall Risk Fall Risk  11/19/2017 11/19/2016 11/15/2015 05/24/2015  Falls in the past year? No No No No   Is the patient's home free of loose throw rugs in walkways, pet beds, electrical cords, etc?   yes      Grab bars in the bathroom? yes  Denies use of shower chair      Handrails on the stairs?   yes        Adequate lighting?   yes  Denies use of walker, cane  or w/c. Also denies use of elevated toilet seat.  Depression Screen PHQ 2/9 Scores 11/19/2017 11/19/2016 11/15/2015 05/24/2015  PHQ - 2 Score 1 0 0 0     Cognitive Function     6CIT Screen 11/19/2017 11/19/2016  What Year? 0 points 0 points  What month? 0 points 0 points  What time? 0 points 0 points  Count back from 20 0 points 0 points  Months in reverse 0 points 0 points  Repeat phrase 0 points 0  points  Total Score 0 0    Immunization History  Administered Date(s) Administered  . Pneumococcal Conjugate-13 11/13/2014  . Pneumococcal Polysaccharide-23 11/24/2006, 12/18/2006  . Zoster 01/17/2015    Qualifies for Shingles Vaccine? Yes. Due for Shingrix vaccine. Education has been provided regarding the importance of this vaccine. Pt has been advised to call her insurance company to determine her out of pocket expense. Advised she may also receive this vaccine at his/her local pharmacy or Health Dept. Verbalized acceptance and understanding.  Screening Tests Health Maintenance  Topic Date Due  . DEXA SCAN  11/19/2018 (Originally 12/14/2006)  . TETANUS/TDAP  11/25/2018 (Originally 12/14/1960)  . INFLUENZA VACCINE  10/25/2019 (Originally 06/24/2017)  . HEMOGLOBIN A1C  12/03/2017  . OPHTHALMOLOGY EXAM  06/01/2018  . FOOT EXAM  06/02/2018  . MAMMOGRAM  11/10/2018  . COLONOSCOPY  06/13/2025  . PNA vac Low Risk Adult  Completed    Cancer Screenings: Breast:  Up to date on Mammogram? Yes  Completed 11/11/17. Repeat mammogram every year Up to date of Bone Density/Dexa? No  Given age, osteoporotic screenings no longer required Colorectal: Completed colonoscopy 06/14/15. Repeat colonoscopy in 10 years     Plan:   I have personally reviewed and addressed the Medicare Annual Wellness questionnaire and have noted the following in the patient's chart:  A. Medical and social history B. Use of alcohol, tobacco or illicit drugs  C. Current medications and supplements D. Functional  ability and status E.  Nutritional status F.  Physical activity G. Advance directives H. List of other physicians I.  Hospitalizations, surgeries, and ER visits in previous 12 months J.  [place] such as hearing and vision if needed, cognitive and depression L. Referrals and appointments - none  In addition, I have reviewed and discussed with patient certain preventive protocols, quality metrics, and best practice recommendations. A written personalized care plan for preventive services as well as general preventive health recommendations were provided to patient.  Signed,  Aleatha Borer, LPN Nurse Health Advisor  MD Recommendations: Due for Tdap and flu vaccine. Declined my offer to administer today. Education has been provided regarding the importance of these vaccines but still declined. Advised she may also receive this vaccine at her local pharmacy or Health Dept. Verbalized acceptance and understanding.  Due for Dexa. Pt declined my offer to order this exam. States she has recently completed this exam. Advised to provide our office with a copy of her results.

## 2017-11-19 NOTE — Patient Instructions (Signed)
Ms. Melody Jones , Thank you for taking time to come for your Medicare Wellness Visit. I appreciate your ongoing commitment to your health goals. Please review the following plan we discussed and let me know if I can assist you in the future.   Screening recommendations/referrals: Colonoscopy: Completed 06/14/15. Repeat colonoscopy in 10 years Mammogram: Completed 11/11/17. Repeat mammogram every year Bone Density: Please provide a copy of your results to our office Recommended yearly ophthalmology/optometry visit for glaucoma screening and checkup Recommended yearly dental visit for hygiene and checkup  Vaccinations: Influenza vaccine: Declined Pneumococcal vaccine: Completed series Tdap vaccine: Declined. Please call your insurance company to determine your out of pocket expense. You may also receive this vaccine at your local pharmacy or Health Dept. Shingles vaccine: Declined. Please call your insurance company to determine your out of pocket expense. You may also receive this vaccine at your local pharmacy or Health Dept.  Advanced directives: Please bring a copy of your POA (Power of Attorney) and/or Living Will to your next appointment.   Conditions/risks identified: Recommend to drink at least 6-8 8oz glasses of water per day  Next appointment: You are scheduled to see Dr. Army Melia on 12/03/17 @ 9:30am.   Please schedule your Annual Wellness Visit with your Nurse Health Advisor in one year.  Preventive Care 59 Years and Older, Female Preventive care refers to lifestyle choices and visits with your health care provider that can promote health and wellness. What does preventive care include?  A yearly physical exam. This is also called an annual well check.  Dental exams once or twice a year.  Routine eye exams. Ask your health care provider how often you should have your eyes checked.  Personal lifestyle choices, including:  Daily care of your teeth and gums.  Regular physical  activity.  Eating a healthy diet.  Avoiding tobacco and drug use.  Limiting alcohol use.  Practicing safe sex.  Taking low-dose aspirin every day.  Taking vitamin and mineral supplements as recommended by your health care provider. What happens during an annual well check? The services and screenings done by your health care provider during your annual well check will depend on your age, overall health, lifestyle risk factors, and family history of disease. Counseling  Your health care provider may ask you questions about your:  Alcohol use.  Tobacco use.  Drug use.  Emotional well-being.  Home and relationship well-being.  Sexual activity.  Eating habits.  History of falls.  Memory and ability to understand (cognition).  Work and work Statistician.  Reproductive health. Screening  You may have the following tests or measurements:  Height, weight, and BMI.  Blood pressure.  Lipid and cholesterol levels. These may be checked every 5 years, or more frequently if you are over 74 years old.  Skin check.  Lung cancer screening. You may have this screening every year starting at age 11 if you have a 30-pack-year history of smoking and currently smoke or have quit within the past 15 years.  Fecal occult blood test (FOBT) of the stool. You may have this test every year starting at age 68.  Flexible sigmoidoscopy or colonoscopy. You may have a sigmoidoscopy every 5 years or a colonoscopy every 10 years starting at age 57.  Hepatitis C blood test.  Hepatitis B blood test.  Sexually transmitted disease (STD) testing.  Diabetes screening. This is done by checking your blood sugar (glucose) after you have not eaten for a while (fasting). You may have this  done every 1-3 years.  Bone density scan. This is done to screen for osteoporosis. You may have this done starting at age 52.  Mammogram. This may be done every 1-2 years. Talk to your health care provider about  how often you should have regular mammograms. Talk with your health care provider about your test results, treatment options, and if necessary, the need for more tests. Vaccines  Your health care provider may recommend certain vaccines, such as:  Influenza vaccine. This is recommended every year.  Tetanus, diphtheria, and acellular pertussis (Tdap, Td) vaccine. You may need a Td booster every 10 years.  Zoster vaccine. You may need this after age 93.  Pneumococcal 13-valent conjugate (PCV13) vaccine. One dose is recommended after age 29.  Pneumococcal polysaccharide (PPSV23) vaccine. One dose is recommended after age 69. Talk to your health care provider about which screenings and vaccines you need and how often you need them. This information is not intended to replace advice given to you by your health care provider. Make sure you discuss any questions you have with your health care provider. Document Released: 12/07/2015 Document Revised: 07/30/2016 Document Reviewed: 09/11/2015 Elsevier Interactive Patient Education  2017 [place] Prevention in the Home Falls can cause injuries. They can happen to people of all ages. There are many things you can do to make your home safe and to help prevent falls. What can I do on the outside of my home?  Regularly fix the edges of walkways and driveways and fix any cracks.  Remove anything that might make you trip as you walk through a door, such as a raised step or threshold.  Trim any bushes or trees on the path to your home.  Use bright outdoor lighting.  Clear any walking paths of anything that might make someone trip, such as rocks or tools.  Regularly check to see if handrails are loose or broken. Make sure that both sides of any steps have handrails.  Any raised decks and porches should have guardrails on the edges.  Have any leaves, snow, or ice cleared regularly.  Use sand or salt on walking paths during  winter.  Clean up any spills in your garage right away. This includes oil or grease spills. What can I do in the bathroom?  Use night lights.  Install grab bars by the toilet and in the tub and shower. Do not use towel bars as grab bars.  Use non-skid mats or decals in the tub or shower.  If you need to sit down in the shower, use a plastic, non-slip stool.  Keep the floor dry. Clean up any water that spills on the floor as soon as it happens.  Remove soap buildup in the tub or shower regularly.  Attach bath mats securely with double-sided non-slip rug tape.  Do not have throw rugs and other things on the floor that can make you trip. What can I do in the bedroom?  Use night lights.  Make sure that you have a light by your bed that is easy to reach.  Do not use any sheets or blankets that are too big for your bed. They should not hang down onto the floor.  Have a firm chair that has side arms. You can use this for support while you get dressed.  Do not have throw rugs and other things on the floor that can make you trip. What can I do in the kitchen?  Clean up any spills  right away.  Avoid walking on wet floors.  Keep items that you use a lot in easy-to-reach places.  If you need to reach something above you, use a strong step stool that has a grab bar.  Keep electrical cords out of the way.  Do not use floor polish or wax that makes floors slippery. If you must use wax, use non-skid floor wax.  Do not have throw rugs and other things on the floor that can make you trip. What can I do with my stairs?  Do not leave any items on the stairs.  Make sure that there are handrails on both sides of the stairs and use them. Fix handrails that are broken or loose. Make sure that handrails are as long as the stairways.  Check any carpeting to make sure that it is firmly attached to the stairs. Fix any carpet that is loose or worn.  Avoid having throw rugs at the top or  bottom of the stairs. If you do have throw rugs, attach them to the floor with carpet tape.  Make sure that you have a light switch at the top of the stairs and the bottom of the stairs. If you do not have them, ask someone to add them for you. What else can I do to help prevent falls?  Wear shoes that:  Do not have high heels.  Have rubber bottoms.  Are comfortable and fit you well.  Are closed at the toe. Do not wear sandals.  If you use a stepladder:  Make sure that it is fully opened. Do not climb a closed stepladder.  Make sure that both sides of the stepladder are locked into place.  Ask someone to hold it for you, if possible.  Clearly mark and make sure that you can see:  Any grab bars or handrails.  First and last steps.  Where the edge of each step is.  Use tools that help you move around (mobility aids) if they are needed. These include:  Canes.  Walkers.  Scooters.  Crutches.  Turn on the lights when you go into a dark area. Replace any light bulbs as soon as they burn out.  Set up your furniture so you have a clear path. Avoid moving your furniture around.  If any of your floors are uneven, fix them.  If there are any pets around you, be aware of where they are.  Review your medicines with your doctor. Some medicines can make you feel dizzy. This can increase your chance of falling. Ask your doctor what other things that you can do to help prevent falls. This information is not intended to replace advice given to you by your health care provider. Make sure you discuss any questions you have with your health care provider. Document Released: 09/06/2009 Document Revised: 04/17/2016 Document Reviewed: 12/15/2014 Elsevier Interactive Patient Education  2017 [employer].

## 2017-12-03 ENCOUNTER — Other Ambulatory Visit: Payer: Self-pay | Admitting: Internal Medicine

## 2017-12-03 ENCOUNTER — Encounter: Payer: Self-pay | Admitting: Internal Medicine

## 2017-12-03 ENCOUNTER — Ambulatory Visit (INDEPENDENT_AMBULATORY_CARE_PROVIDER_SITE_OTHER): Payer: Medicare Other | Admitting: Internal Medicine

## 2017-12-03 VITALS — BP 130/84 | HR 83 | Ht 62.0 in | Wt 175.0 lb

## 2017-12-03 DIAGNOSIS — E119 Type 2 diabetes mellitus without complications: Secondary | ICD-10-CM | POA: Diagnosis not present

## 2017-12-03 DIAGNOSIS — I10 Essential (primary) hypertension: Secondary | ICD-10-CM | POA: Diagnosis not present

## 2017-12-03 DIAGNOSIS — E1169 Type 2 diabetes mellitus with other specified complication: Secondary | ICD-10-CM | POA: Diagnosis not present

## 2017-12-03 DIAGNOSIS — Z0000 Encounter for general adult medical examination without abnormal findings: Secondary | ICD-10-CM

## 2017-12-03 DIAGNOSIS — E785 Hyperlipidemia, unspecified: Secondary | ICD-10-CM

## 2017-12-03 DIAGNOSIS — R413 Other amnesia: Secondary | ICD-10-CM

## 2017-12-03 DIAGNOSIS — F0150 Vascular dementia without behavioral disturbance: Secondary | ICD-10-CM | POA: Insufficient documentation

## 2017-12-03 LAB — POCT URINALYSIS DIPSTICK
Bilirubin, UA: NEGATIVE
Glucose, UA: NEGATIVE
KETONES UA: NEGATIVE
Leukocytes, UA: NEGATIVE
Nitrite, UA: NEGATIVE
PH UA: 6 (ref 5.0–8.0)
Protein, UA: NEGATIVE
SPEC GRAV UA: 1.015 (ref 1.010–1.025)
UROBILINOGEN UA: 0.2 U/dL

## 2017-12-03 NOTE — Progress Notes (Signed)
Date:  12/03/2017   Name:  Melody Jones   DOB:  [birth year]   MRN:  [id number]   Chief Complaint: Annual Exam (Breast Exam. ) Melody Jones is a 77 y.o. female who presents today for her Complete Annual Exam. She feels well. She reports exercising none. She reports she is sleeping well. She had MAW in December.  Diabetes  She presents for her follow-up diabetic visit. She has type 2 diabetes mellitus. Her disease course has been stable. Pertinent negatives for hypoglycemia include no dizziness, headaches, nervousness/anxiousness or tremors. Pertinent negatives for diabetes include no chest pain, no fatigue, no foot paresthesias, no polydipsia, no polyuria, no visual change and no weakness. Symptoms are stable. Current diabetic treatment includes diet. She is compliant with treatment all of the time.  Hypertension  Pertinent negatives include no chest pain, headaches, palpitations or shortness of breath.  Hyperlipidemia  This is a chronic problem. There are no known factors aggravating her hyperlipidemia. Pertinent negatives include no chest pain or shortness of breath. Current antihyperlipidemic treatment includes statins.  Memory changes - She and her husband have noted some mild memory issues recently.  She has struggled with paying bills - no major errors but not a focused as in the past.  She still drives and has not gotten lost.  She did leave a pan on the stove and it burned.  She can not tell me what she had for dinner last night.  MAW screening last month was perfect.  She denies HA, depression, vision changes, focal weakness, falls or imbalance.  She does have urinary issues and is taking Toviaz from GYN.    Review of Systems  Constitutional: Negative for chills, fatigue and fever.  HENT: Negative for congestion, hearing loss, tinnitus, trouble swallowing and voice change.   Eyes: Negative for visual disturbance.  Respiratory: Negative for cough, chest tightness, shortness of  breath and wheezing.   Cardiovascular: Negative for chest pain, palpitations and leg swelling.  Gastrointestinal: Negative for abdominal pain, constipation, diarrhea and vomiting.  Endocrine: Negative for polydipsia and polyuria.  Genitourinary: Negative for dysuria, frequency, genital sores, vaginal bleeding and vaginal discharge.  Musculoskeletal: Negative for arthralgias, gait problem and joint swelling.  Skin: Negative for color change and rash.  Neurological: Negative for dizziness, tremors, weakness, light-headedness and headaches.  Hematological: Negative for adenopathy. Does not bruise/bleed easily.  Psychiatric/Behavioral: Positive for decreased concentration (mild memory issues). Negative for dysphoric mood and sleep disturbance. The patient is not nervous/anxious.     Patient Active Problem List   Diagnosis Date Noted  . Localized, primary osteoarthritis 06/02/2017  . Type 2 diabetes mellitus without complication, without long-term current use of insulin ([place]) 11/19/2016  . History of malignant melanoma 06/05/2015  . Chronic left hip pain 05/24/2015  . Dyslipidemia 03/15/2015  . Essential (primary) hypertension 03/15/2015  . OP (osteoporosis) 03/15/2015  . Allergic rhinitis, seasonal 03/15/2015  . Urge incontinence 03/15/2015  . Avitaminosis D 03/15/2015    Prior to Admission medications   Medication Sig Start Date End Date Taking? Authorizing Provider  alendronate (FOSAMAX) 70 MG tablet alendronate 70 mg tablet   Yes [provider]  aspirin EC 81 MG tablet Take 1 tablet (81 mg total) by mouth daily. 11/19/16  Yes Glean Hess, MD  atorvastatin (LIPITOR) 10 MG tablet TAKE 1 TABLET AT BEDTIME 02/16/17  Yes Glean Hess, MD  Cholecalciferol (VITAMIN D-1000 MAX ST) 1000 UNITS tablet Take 1 tablet by mouth daily.  Yes [provider]  Ezetimibe (ZETIA PO) Zetia   Yes [provider]  fesoterodine (TOVIAZ) 8 MG TB24 tablet Take 1 tablet by  mouth daily.   Yes [provider]  lisinopril (PRINIVIL,ZESTRIL) 20 MG tablet TAKE 1 TABLET DAILY 02/04/17  Yes Glean Hess, MD  meloxicam (MOBIC) 15 MG tablet Take 15 mg by mouth daily as needed for pain.   Yes [provider]    Allergies  Allergen Reactions  . Latex Swelling    Past Surgical History:  Procedure Laterality Date  . BUNIONECTOMY  2013  . COLONOSCOPY N/A 06/14/2015   Procedure: COLONOSCOPY;  Surgeon: Manya Silvas, MD;  Location: [facility] ENDOSCOPY;  Service: Endoscopy;  Laterality: N/A;  . MELANOMA EXCISION Left Shoulder  . TUBAL LIGATION      Social History   Tobacco Use  . Smoking status: Former Smoker    Packs/day: 0.25    Years: 20.00    Pack years: 5.00    Types: Cigarettes    Last attempt to quit: 1988    Years since quitting: 31.0  . Smokeless tobacco: Never Used  . Tobacco comment: Smoking cessation materials not required  Substance Use Topics  . Alcohol use: No    Alcohol/week: 0.0 oz    Frequency: Never  . Drug use: No     Medication list has been reviewed and updated.  PHQ 2/9 Scores 11/19/2017 11/19/2016 11/15/2015 05/24/2015  PHQ - 2 Score 1 0 0 0   6CIT Screen 11/19/2017 11/19/2016  What Year? 0 points 0 points  What month? 0 points 0 points  What time? 0 points 0 points  Count back from 20 0 points 0 points  Months in reverse 0 points 0 points  Repeat phrase 0 points 0 points  Total Score 0 0      Physical Exam  Constitutional: She is oriented to person, place, and time. She appears well-developed and well-nourished. No distress.  HENT:  Head: Normocephalic and atraumatic.  Right Ear: Tympanic membrane and ear canal normal.  Left Ear: Tympanic membrane and ear canal normal.  Nose: Right sinus exhibits no maxillary sinus tenderness. Left sinus exhibits no maxillary sinus tenderness.  Mouth/Throat: Uvula is midline and oropharynx is clear and moist.  Eyes: Conjunctivae and EOM are normal. Right eye  exhibits no discharge. Left eye exhibits no discharge. No scleral icterus.  Neck: Normal range of motion. Carotid bruit is not present. No erythema present. No thyromegaly present.  Cardiovascular: Normal rate, regular rhythm, normal heart sounds and normal pulses.  Pulmonary/Chest: Effort normal. No respiratory distress. She has no wheezes. Right breast exhibits no mass, no nipple discharge, no skin change and no tenderness. Left breast exhibits no mass, no nipple discharge, no skin change and no tenderness.  Abdominal: Soft. Bowel sounds are normal. There is no hepatosplenomegaly. There is no tenderness. There is no CVA tenderness.  Musculoskeletal: Normal range of motion. She exhibits no edema or tenderness.  Lymphadenopathy:    She has no cervical adenopathy.    She has no axillary adenopathy.  Neurological: She is alert and oriented to person, place, and time. She has normal reflexes. No cranial nerve deficit or sensory deficit.  Skin: Skin is warm, dry and intact. No rash noted.  Psychiatric: She has a normal mood and affect. Her speech is normal and behavior is normal. Judgment and thought content normal. She exhibits abnormal recent memory.  Nursing note and vitals reviewed.   BP 130/84  Pulse 83   Ht 5\' 2"  (1.575 m)   Wt 175 lb (79.4 kg)   SpO2 97%   BMI 32.01 kg/m   Assessment and Plan: 1. Annual physical exam  2. Type 2 diabetes mellitus without complication, without long-term current use of insulin (HCC) Encourage regular exercise Consider checking BS more regularly - Comprehensive metabolic panel - Hemoglobin A1c  3. Essential (primary) hypertension controlled - CBC with Differential/Platelet - POCT urinalysis dipstick  4. Hyperlipidemia associated with type 2 diabetes mellitus ([place]) On statin - Lipid panel  5. Memory changes - Vitamin B12 - Sedimentation rate - Ambulatory referral to Neurology   No orders of the defined types were placed in this  encounter.   Partially dictated using Editor, commissioning. Any errors are unintentional.  Halina Maidens, MD [facility]  12/03/2017

## 2017-12-04 LAB — CBC WITH DIFFERENTIAL/PLATELET
BASOS ABS: 0 10*3/uL (ref 0.0–0.2)
Basos: 1 %
EOS (ABSOLUTE): 0.5 10*3/uL — AB (ref 0.0–0.4)
Eos: 9 %
Hematocrit: 41.3 % (ref 34.0–46.6)
Hemoglobin: 14.3 g/dL (ref 11.1–15.9)
IMMATURE GRANULOCYTES: 0 %
Immature Grans (Abs): 0 10*3/uL (ref 0.0–0.1)
LYMPHS ABS: 1.7 10*3/uL (ref 0.7–3.1)
Lymphs: 31 %
MCH: 29.9 pg (ref 26.6–33.0)
MCHC: 34.6 g/dL (ref 31.5–35.7)
MCV: 86 fL (ref 79–97)
MONOS ABS: 0.3 10*3/uL (ref 0.1–0.9)
Monocytes: 6 %
NEUTROS PCT: 53 %
Neutrophils Absolute: 2.9 10*3/uL (ref 1.4–7.0)
PLATELETS: 295 10*3/uL (ref 150–379)
RBC: 4.78 x10E6/uL (ref 3.77–5.28)
RDW: 13.8 % (ref 12.3–15.4)
WBC: 5.4 10*3/uL (ref 3.4–10.8)

## 2017-12-04 LAB — COMPREHENSIVE METABOLIC PANEL
ALK PHOS: 70 IU/L (ref 39–117)
ALT: 19 IU/L (ref 0–32)
AST: 16 IU/L (ref 0–40)
Albumin/Globulin Ratio: 1.5 (ref 1.2–2.2)
Albumin: 4.4 g/dL (ref 3.5–4.8)
BUN/Creatinine Ratio: 15 (ref 12–28)
BUN: 10 mg/dL (ref 8–27)
Bilirubin Total: 0.5 mg/dL (ref 0.0–1.2)
CO2: 26 mmol/L (ref 20–29)
CREATININE: 0.67 mg/dL (ref 0.57–1.00)
Calcium: 9.7 mg/dL (ref 8.7–10.3)
Chloride: 104 mmol/L (ref 96–106)
GFR calc Af Amer: 99 mL/min/{1.73_m2} (ref >59)
GFR calc non Af Amer: 86 mL/min/{1.73_m2} (ref >59)
GLUCOSE: 149 mg/dL — AB (ref 65–99)
Globulin, Total: 2.9 g/dL (ref 1.5–4.5)
Potassium: 4.4 mmol/L (ref 3.5–5.2)
Sodium: 142 mmol/L (ref 134–144)
Total Protein: 7.3 g/dL (ref 6.0–8.5)

## 2017-12-04 LAB — LIPID PANEL
CHOLESTEROL TOTAL: 181 mg/dL (ref 100–199)
Chol/HDL Ratio: 2.9 ratio (ref 0.0–4.4)
HDL: 62 mg/dL (ref >39)
LDL Calculated: 97 mg/dL (ref 0–99)
Triglycerides: 108 mg/dL (ref 0–149)
VLDL Cholesterol Cal: 22 mg/dL (ref 5–40)

## 2017-12-04 LAB — HEMOGLOBIN A1C
ESTIMATED AVERAGE GLUCOSE: 166 mg/dL
HEMOGLOBIN A1C: 7.4 % — AB (ref 4.8–5.6)

## 2017-12-04 LAB — SEDIMENTATION RATE: SED RATE: 3 mm/h (ref 0–40)

## 2017-12-04 LAB — VITAMIN B12: Vitamin B-12: 386 pg/mL (ref 232–1245)

## 2017-12-05 LAB — MICROALBUMIN / CREATININE URINE RATIO
CREATININE, UR: 59.9 mg/dL
MICROALB/CREAT RATIO: 5.3 mg/g{creat} (ref 0.0–30.0)
MICROALBUM., U, RANDOM: 3.2 ug/mL

## 2018-01-19 LAB — HM DIABETES EYE EXAM

## 2018-01-30 ENCOUNTER — Other Ambulatory Visit: Payer: Self-pay | Admitting: Internal Medicine

## 2018-01-30 DIAGNOSIS — I10 Essential (primary) hypertension: Secondary | ICD-10-CM

## 2018-02-14 ENCOUNTER — Other Ambulatory Visit: Payer: Self-pay | Admitting: Internal Medicine

## 2018-04-02 ENCOUNTER — Ambulatory Visit: Payer: Medicare Other | Admitting: Internal Medicine

## 2018-04-05 ENCOUNTER — Encounter: Payer: Medicare Other | Admitting: Internal Medicine

## 2018-04-14 ENCOUNTER — Other Ambulatory Visit: Payer: Self-pay | Admitting: Nurse Practitioner

## 2018-04-14 ENCOUNTER — Other Ambulatory Visit (HOSPITAL_COMMUNITY): Payer: Self-pay | Admitting: Nurse Practitioner

## 2018-04-14 DIAGNOSIS — R413 Other amnesia: Secondary | ICD-10-CM

## 2018-04-22 ENCOUNTER — Other Ambulatory Visit (HOSPITAL_COMMUNITY): Payer: Self-pay | Admitting: Nurse Practitioner

## 2018-04-22 ENCOUNTER — Ambulatory Visit (HOSPITAL_COMMUNITY)
Admission: RE | Admit: 2018-04-22 | Discharge: 2018-04-22 | Disposition: A | Discharge status: Home / Self Care | Payer: Medicare Other | Source: Ambulatory Visit | Attending: Nurse Practitioner | Admitting: Nurse Practitioner

## 2018-04-22 DIAGNOSIS — G319 Degenerative disease of nervous system, unspecified: Secondary | ICD-10-CM | POA: Insufficient documentation

## 2018-04-22 DIAGNOSIS — R413 Other amnesia: Secondary | ICD-10-CM

## 2018-05-02 ENCOUNTER — Other Ambulatory Visit: Payer: Self-pay | Admitting: Internal Medicine

## 2018-05-02 DIAGNOSIS — I10 Essential (primary) hypertension: Secondary | ICD-10-CM

## 2018-07-31 ENCOUNTER — Other Ambulatory Visit: Payer: Self-pay | Admitting: Internal Medicine

## 2018-07-31 DIAGNOSIS — I10 Essential (primary) hypertension: Secondary | ICD-10-CM

## 2018-08-09 ENCOUNTER — Ambulatory Visit: Payer: Medicare Other | Admitting: Internal Medicine

## 2018-08-09 ENCOUNTER — Encounter: Payer: Self-pay | Admitting: Internal Medicine

## 2018-08-09 VITALS — BP 142/82 | HR 69 | Ht 62.0 in | Wt 166.0 lb

## 2018-08-09 DIAGNOSIS — E119 Type 2 diabetes mellitus without complications: Secondary | ICD-10-CM

## 2018-08-09 DIAGNOSIS — E538 Deficiency of other specified B group vitamins: Secondary | ICD-10-CM | POA: Diagnosis not present

## 2018-08-09 DIAGNOSIS — I10 Essential (primary) hypertension: Secondary | ICD-10-CM | POA: Diagnosis not present

## 2018-08-09 DIAGNOSIS — F0150 Vascular dementia without behavioral disturbance: Secondary | ICD-10-CM

## 2018-08-09 NOTE — Progress Notes (Signed)
Date:  08/09/2018   Name:  Melody Jones   DOB:  January 21, 1941   MRN:  [id number]   Chief Complaint: Diabetes and Hypertension Diabetes  She presents for her follow-up diabetic visit. She has type 2 diabetes mellitus. Her disease course has been stable. Hypoglycemia symptoms include confusion. Pertinent negatives for hypoglycemia include no headaches, nervousness/anxiousness or tremors. Pertinent negatives for diabetes include no chest pain, no fatigue, no polydipsia and no polyuria. Symptoms are stable. Current diabetic treatment includes diet. She is compliant with treatment most of the time. An ACE inhibitor/angiotensin II receptor blocker is being taken.  Hypertension  This is a chronic problem. The problem is unchanged. Pertinent negatives include no chest pain, headaches, palpitations or shortness of breath. Past treatments include ACE inhibitors.  Memory loss - has had workup by Neurology.  MRI c/w vascular dementia.  Recommended aspirin 81 mg and B12 supplements. She wants to continue to drive but is anxious about it.  She has not had an accident but she has gotten lost getting home.  Her husband is with her today and is very supportive.  He can drive and they have several children and grandchildren who are willing and able to help with transportation.  Lab Results  Component Value Date   HGBA1C 7.4 (H) 12/03/2017      Review of Systems  Constitutional: Negative for appetite change, fatigue, fever and unexpected weight change.  HENT: Negative for tinnitus and trouble swallowing.   Eyes: Negative for visual disturbance.  Respiratory: Negative for cough, chest tightness and shortness of breath.   Cardiovascular: Negative for chest pain, palpitations and leg swelling.  Gastrointestinal: Negative for abdominal pain.  Endocrine: Negative for polydipsia and polyuria.  Genitourinary: Negative for dysuria and hematuria.  Musculoskeletal: Negative for arthralgias.  Neurological:  Negative for tremors, numbness and headaches.  Psychiatric/Behavioral: Positive for confusion and decreased concentration. Negative for dysphoric mood and sleep disturbance. The patient is not nervous/anxious.     Patient Active Problem List   Diagnosis Date Noted  . Memory changes 12/03/2017  . Localized, primary osteoarthritis 06/02/2017  . Type 2 diabetes mellitus without complication, without long-term current use of insulin ([place]) 11/19/2016  . History of malignant melanoma 06/05/2015  . Chronic left hip pain 05/24/2015  . Dyslipidemia 03/15/2015  . Essential (primary) hypertension 03/15/2015  . OP (osteoporosis) 03/15/2015  . Allergic rhinitis, seasonal 03/15/2015  . Urge incontinence 03/15/2015  . Avitaminosis D 03/15/2015    Allergies  Allergen Reactions  . Latex Swelling    Past Surgical History:  Procedure Laterality Date  . BUNIONECTOMY  2013  . COLONOSCOPY N/A 06/14/2015   Procedure: COLONOSCOPY;  Surgeon: Manya Silvas, MD;  Location: [facility] ENDOSCOPY;  Service: Endoscopy;  Laterality: N/A;  . MELANOMA EXCISION Left Shoulder  . TUBAL LIGATION      Social History   Tobacco Use  . Smoking status: Former Smoker    Packs/day: 0.25    Years: 20.00    Pack years: 5.00    Types: Cigarettes    Last attempt to quit: 1988    Years since quitting: 31.7  . Smokeless tobacco: Never Used  . Tobacco comment: Smoking cessation materials not required  Substance Use Topics  . Alcohol use: No    Alcohol/week: 0.0 standard drinks    Frequency: Never  . Drug use: No     Medication list has been reviewed and updated.  Current Meds  Medication Sig  . aspirin EC 81 MG tablet  Take 1 tablet (81 mg total) by mouth daily.  Marland Kitchen atorvastatin (LIPITOR) 10 MG tablet TAKE 1 TABLET AT BEDTIME  . Cholecalciferol (VITAMIN D-1000 MAX ST) 1000 UNITS tablet Take 1 tablet by mouth daily.  . Ezetimibe (ZETIA PO) Zetia  . fesoterodine (TOVIAZ) 8 MG TB24 tablet Take 1 tablet by mouth  daily.  Marland Kitchen lisinopril (PRINIVIL,ZESTRIL) 20 MG tablet TAKE 1 TABLET DAILY (Patient taking differently: 20 mg. )  . sertraline (ZOLOFT) 25 MG tablet Take 25 mg by mouth daily.  . vitamin B-12 (CYANOCOBALAMIN) 1000 MCG tablet Take 1,000 mcg by mouth daily.    PHQ 2/9 Scores 11/19/2017 11/19/2016 11/15/2015 05/24/2015  PHQ - 2 Score 1 0 0 0    Physical Exam  Constitutional: She is oriented to person, place, and time. She appears well-developed. No distress.  HENT:  Head: Normocephalic and atraumatic.  Neck: Normal range of motion. Neck supple.  Cardiovascular: Normal rate, regular rhythm and normal heart sounds.  Pulmonary/Chest: Effort normal and breath sounds normal. No respiratory distress.  Musculoskeletal: Normal range of motion. She exhibits no edema or tenderness.  Neurological: She is alert and oriented to person, place, and time.  Skin: Skin is warm and dry. No rash noted.  Psychiatric: She has a normal mood and affect. Her behavior is normal. Thought content normal. She exhibits abnormal recent memory.  Nursing note and vitals reviewed.   BP (!) 142/82 (BP Location: Right Arm, Patient Position: Sitting, Cuff Size: Normal)   Pulse 69   Ht 5\' 2"  (1.575 m)   Wt 166 lb (75.3 kg)   SpO2 97%   BMI 30.36 kg/m   Assessment and Plan: 1. Type 2 diabetes mellitus without complication, without long-term current use of insulin (HCC) Continue healthy diet Need to schedule diabetic eye exam - Hemoglobin A1c  2. Essential (primary) hypertension Fair control - will adjust if needed next visit - Comprehensive metabolic panel - TSH  3. B12 deficiency due to diet Continue supplementation - Vitamin B12  4. Vascular dementia without behavioral disturbance Continue aspirin and sertraline   No orders of the defined types were placed in this encounter.   Partially dictated using Editor, commissioning. Any errors are unintentional.  Halina Maidens, MD [facility]  08/09/2018

## 2018-08-10 LAB — COMPREHENSIVE METABOLIC PANEL
ALBUMIN: 4.6 g/dL (ref 3.5–4.8)
ALT: 18 IU/L (ref 0–32)
AST: 20 IU/L (ref 0–40)
Albumin/Globulin Ratio: 1.6 (ref 1.2–2.2)
Alkaline Phosphatase: 71 IU/L (ref 39–117)
BUN / CREAT RATIO: 13 (ref 12–28)
BUN: 10 mg/dL (ref 8–27)
Bilirubin Total: 0.3 mg/dL (ref 0.0–1.2)
CALCIUM: 9.4 mg/dL (ref 8.7–10.3)
CO2: 21 mmol/L (ref 20–29)
CREATININE: 0.77 mg/dL (ref 0.57–1.00)
Chloride: 99 mmol/L (ref 96–106)
GFR calc Af Amer: 87 mL/min/{1.73_m2} (ref >59)
GFR, EST NON AFRICAN AMERICAN: 75 mL/min/{1.73_m2} (ref >59)
GLOBULIN, TOTAL: 2.9 g/dL (ref 1.5–4.5)
GLUCOSE: 138 mg/dL — AB (ref 65–99)
Potassium: 4.4 mmol/L (ref 3.5–5.2)
SODIUM: 141 mmol/L (ref 134–144)
TOTAL PROTEIN: 7.5 g/dL (ref 6.0–8.5)

## 2018-08-10 LAB — TSH: TSH: 3.13 u[IU]/mL (ref 0.450–4.500)

## 2018-08-10 LAB — VITAMIN B12: Vitamin B-12: 633 pg/mL (ref 232–1245)

## 2018-08-10 LAB — HEMOGLOBIN A1C
ESTIMATED AVERAGE GLUCOSE: 151 mg/dL
HEMOGLOBIN A1C: 6.9 % — AB (ref 4.8–5.6)

## 2018-08-11 ENCOUNTER — Ambulatory Visit: Payer: Medicare Other | Admitting: Internal Medicine

## 2018-08-12 ENCOUNTER — Encounter: Payer: Self-pay | Admitting: Internal Medicine

## 2018-08-16 ENCOUNTER — Telehealth: Payer: Self-pay

## 2018-08-16 NOTE — Telephone Encounter (Signed)
Patient left VM that she has an old RX from PCP prior to coming here and seeing [facility]. She asked if she needs to fill it or should she go with Dr.Berglund? She then tried to find name of this med and could not describe it or what it is for. I called back and the VM was full so I called [facility] and LM stating we would need more info and that if she wants PCP to be [facility] then she will need to follow her recommendations unless disputing them or sent to specialist. I asked that she call me with name of med and DX for this so I can research notes from [facility] to see if she should be taking meds from other PCP. Will add to this note when we rcv call back.

## 2018-10-29 ENCOUNTER — Other Ambulatory Visit: Payer: Self-pay | Admitting: Internal Medicine

## 2018-10-29 DIAGNOSIS — I10 Essential (primary) hypertension: Secondary | ICD-10-CM

## 2018-11-22 ENCOUNTER — Other Ambulatory Visit: Payer: Self-pay | Admitting: Internal Medicine

## 2018-11-22 ENCOUNTER — Ambulatory Visit (INDEPENDENT_AMBULATORY_CARE_PROVIDER_SITE_OTHER): Payer: Medicare Other

## 2018-11-22 VITALS — BP 158/86 | HR 78 | Temp 97.6°F | Resp 16 | Ht 62.0 in | Wt 166.8 lb

## 2018-11-22 DIAGNOSIS — Z0000 Encounter for general adult medical examination without abnormal findings: Secondary | ICD-10-CM | POA: Diagnosis not present

## 2018-11-22 DIAGNOSIS — Z1231 Encounter for screening mammogram for malignant neoplasm of breast: Secondary | ICD-10-CM

## 2018-11-22 NOTE — Progress Notes (Signed)
Subjective:   Melody Jones is a 77 y.o. female who presents for Medicare Annual (Subsequent) preventive examination.  Review of Systems:   Cardiac Risk Factors include: advanced age (>32men, >53 women);diabetes mellitus;dyslipidemia;hypertension;obesity (BMI >30kg/m2)     Objective:     Vitals: BP (!) 158/86 (BP Location: Left Arm, Patient Position: Sitting, Cuff Size: Normal)   Pulse 78   Temp 97.6 F (36.4 C) (Oral)   Resp 16   Ht 5\' 2"  (1.575 m)   Wt 166 lb 12.8 oz (75.7 kg)   SpO2 93%   BMI 30.51 kg/m   Body mass index is 30.51 kg/m.  Advanced Directives 11/22/2018 11/19/2017 11/19/2016 11/15/2015 06/14/2015  Does Patient Have a Medical Advance Directive? Yes Yes No;Yes Yes No  Type of Paramedic of [place];Living will [place];Living will Living will [place];Living will -  Copy of [place] in Chart? No - copy requested No - copy requested - No - copy requested -    Tobacco Social History   Tobacco Use  Smoking Status Former Smoker  . Packs/day: 0.25  . Years: 20.00  . Pack years: 5.00  . Types: Cigarettes  . Last attempt to quit: 1988  . Years since quitting: 32.0  Smokeless Tobacco Never Used  Tobacco Comment   Smoking cessation materials not required     Counseling given: Not Answered Comment: Smoking cessation materials not required   Clinical Intake:  Pre-visit preparation completed: Yes  Pain : No/denies pain     BMI - recorded: 30.36 Nutritional Status: BMI > 30  Obese Nutritional Risks: None Diabetes: Yes CBG done?: No Did pt. bring in CBG monitor from home?: No   Nutrition Risk Assessment:  Has the patient had any N/V/D within the last 2 months?  No  Does the patient have any non-healing wounds?  No  Has the patient had any unintentional weight loss or weight gain?  No   Diabetes:  Is the patient diabetic?  Yes  If diabetic, was a CBG  obtained today?  No  Did the patient bring in their glucometer from home?  No  How often do you monitor your CBG's? Pt does not actively check her blood sugar.   Financial Strains and Diabetes Management:  Are you having any financial strains with the device, your supplies or your medication? No .  Does the patient want to be seen by Chronic Care Management for management of their diabetes?  No  Would the patient like to be referred to a Nutritionist or for Diabetic Management?  No   Diabetic Exams:  Diabetic Eye Exam: Completed 01/19/18, negative retinopathy.   Diabetic Foot Exam: Completed 12/03/17.   How often do you need to have someone help you when you read instructions, pamphlets, or other written materials from your doctor or pharmacy?: 1 - Never What is the last grade level you completed in school?: business school  Interpreter Needed?: No  Information entered by :: Clemetine Marker LPN  Past Medical History:  Diagnosis Date  . Anxiety   . Cancer ([place])    Melanoma LT Shoulder  . Depression   . Diabetes mellitus without complication ([place])   . Hyperlipidemia   . Hypertension   . Osteoporosis    Past Surgical History:  Procedure Laterality Date  . BUNIONECTOMY  2013  . COLONOSCOPY N/A 06/14/2015   Procedure: COLONOSCOPY;  Surgeon: Manya Silvas, MD;  Location: [facility];  Service: Endoscopy;  Laterality: N/A;  . MELANOMA EXCISION Left Shoulder  . TUBAL LIGATION     Family History  Problem Relation Age of Onset  . Alcohol abuse Mother   . Stroke Father   . Cancer Father   . Breast cancer Neg Hx    Social History   Socioeconomic History  . Marital status: Married    Spouse name: Not on file  . Number of children: 3  . Years of education: Not on file  . Highest education level: Some college, no degree  Occupational History  . Occupation: Retired  Scientific laboratory technician  . Financial resource strain: Not hard at all  . Food insecurity:    Worry: Never true     Inability: Never true  . Transportation needs:    Medical: No    Non-medical: No  Tobacco Use  . Smoking status: Former Smoker    Packs/day: 0.25    Years: 20.00    Pack years: 5.00    Types: Cigarettes    Last attempt to quit: 1988    Years since quitting: 32.0  . Smokeless tobacco: Never Used  . Tobacco comment: Smoking cessation materials not required  Substance and Sexual Activity  . Alcohol use: Yes    Alcohol/week: 0.0 standard drinks    Frequency: Never    Comment: daily "night cap"  . Drug use: No  . Sexual activity: Not Currently  Lifestyle  . Physical activity:    Days per week: 0 days    Minutes per session: 0 min  . Stress: To some extent  Relationships  . Social connections:    Talks on phone: Once a week    Gets together: Twice a week    Attends religious service: More than 4 times per year    Active member of club or organization: No    Attends meetings of clubs or organizations: Never    Relationship status: Married  Other Topics Concern  . Not on file  Social History Narrative  . Not on file    Outpatient Encounter Medications as of 11/22/2018  Medication Sig  . aspirin EC 81 MG tablet Take 1 tablet (81 mg total) by mouth daily.  Marland Kitchen atorvastatin (LIPITOR) 10 MG tablet TAKE 1 TABLET AT BEDTIME  . lisinopril (PRINIVIL,ZESTRIL) 20 MG tablet TAKE 1 TABLET DAILY  . sertraline (ZOLOFT) 25 MG tablet Take 25 mg by mouth daily.  Marland Kitchen tolterodine (DETROL LA) 4 MG 24 hr capsule Take 4 mg by mouth daily.  . Cholecalciferol (VITAMIN D-1000 MAX ST) 1000 UNITS tablet Take 1 tablet by mouth daily.  . vitamin B-12 (CYANOCOBALAMIN) 1000 MCG tablet Take 1,000 mcg by mouth daily.  . [DISCONTINUED] Ezetimibe (ZETIA PO) Zetia  . [DISCONTINUED] fesoterodine (TOVIAZ) 8 MG TB24 tablet Take 1 tablet by mouth daily.   No facility-administered encounter medications on file as of 11/22/2018.     Activities of Daily Living In your present state of health, do you have any  difficulty performing the following activities: 11/22/2018  Hearing? N  Comment declines hearing aids  Vision? N  Comment wears glasses  Difficulty concentrating or making decisions? Y  Comment memory issues  Walking or climbing stairs? N  Dressing or bathing? N  Doing errands, shopping? N  Preparing Food and eating ? N  Using the Toilet? N  In the past six months, have you accidently leaked urine? Y  Comment taking detrol  Do you have problems with loss of bowel control? N  Managing your Medications? N  Managing your Finances? N  Housekeeping or managing your Housekeeping? N  Some recent data might be hidden    Patient Care Team: Glean Hess, MD as PCP - General (Internal Medicine) Schermerhorn, Gwen Her, MD as Consulting Physician (Obstetrics and Gynecology) Manya Silvas, MD (Gastroenterology)    Assessment:   This is a routine wellness examination for Melody Jones.  Exercise Activities and Dietary recommendations Current Exercise Habits: The patient does not participate in regular exercise at present, Exercise limited by: None identified  Goals    . DIET - DECREASE SODA OR JUICE INTAKE     Decrease pepsi intake to one per day and drink more water    . DIET - INCREASE WATER INTAKE     Recommend to drink at least 6-8 8oz glasses of water per day    . Weight (lb) < 200 lb (90.7 kg)     Stay healthty       Fall Risk Fall Risk  11/22/2018 11/19/2017 11/19/2016 11/15/2015 05/24/2015  Falls in the past year? 0 No No No No  Number falls in past yr: 0 - - - -   FALL RISK PREVENTION PERTAINING TO THE HOME:  Any stairs in or around the home WITH handrails? Yes  Home free of loose throw rugs in walkways, pet beds, electrical cords, etc? Yes  Adequate lighting in your home to reduce risk of falls? Yes   ASSISTIVE DEVICES UTILIZED TO PREVENT FALLS:  Life alert? No  Use of a cane, walker or w/c? No  Grab bars in the bathroom? Yes  Shower chair or bench in shower?  Yes  Elevated toilet seat or a handicapped toilet? Yes   DME ORDERS:  DME order needed?  No   TIMED UP AND GO:  Was the test performed? Yes .  Length of time to ambulate 10 feet: 5 sec.   GAIT:  Appearance of gait: Gait stead-fast and without the use of an assistive device.  Education: Fall risk prevention has been discussed.  Intervention(s) required? No    Depression Screen PHQ 2/9 Scores 11/22/2018 11/19/2017 11/19/2016 11/15/2015  PHQ - 2 Score 1 1 0 0  PHQ- 9 Score 3 - - -     Cognitive Function MMSE - Mini Mental State Exam 11/22/2018  Orientation to time 4  Orientation to Place 3  Registration 3  Attention/ Calculation 5  Recall 3  Language- name 2 objects 2  Language- repeat 1  Language- follow 3 step command 3  Language- read & follow direction 1  Write a sentence 1  Copy design 0  Total score 26     6CIT Screen 11/19/2017 11/19/2016  What Year? 0 points 0 points  What month? 0 points 0 points  What time? 0 points 0 points  Count back from 20 0 points 0 points  Months in reverse 0 points 0 points  Repeat phrase 0 points 0 points  Total Score 0 0    Immunization History  Administered Date(s) Administered  . Pneumococcal Conjugate-13 11/13/2014  . Pneumococcal Polysaccharide-23 11/24/2006, 12/18/2006  . Zoster 01/17/2015    Qualifies for Shingles Vaccine? Yes  Zostavax completed 2016. Due for Shingrix. Education has been provided regarding the importance of this vaccine. Pt has been advised to call insurance company to determine out of pocket expense. Advised may also receive vaccine at local pharmacy or Health Dept. Verbalized acceptance and understanding.  Tdap: Although this vaccine is not a covered service  during a Wellness Exam, does the patient still wish to receive this vaccine today?  No .  Education has been provided regarding the importance of this vaccine. Advised may receive this vaccine at local pharmacy or Health Dept. Aware to provide  a copy of the vaccination record if obtained from local pharmacy or Health Dept. Verbalized acceptance and understanding.  Flu Vaccine: Due for Flu vaccine. Does the patient want to receive this vaccine today?  No . Education has been provided regarding the importance of this vaccine but still declined. Advised may receive this vaccine at local pharmacy or Health Dept. Aware to provide a copy of the vaccination record if obtained from local pharmacy or Health Dept. Verbalized acceptance and understanding.  Pneumococcal Vaccine: Up to date    Screening Tests Health Maintenance  Topic Date Due  . MAMMOGRAM  11/10/2018  . TETANUS/TDAP  11/25/2018 (Originally 12/14/1959)  . INFLUENZA VACCINE  10/25/2019 (Originally 06/24/2018)  . FOOT EXAM  12/03/2018  . OPHTHALMOLOGY EXAM  01/19/2019  . HEMOGLOBIN A1C  02/07/2019  . DEXA SCAN  Completed  . PNA vac Low Risk Adult  Completed   Cancer Screenings:  Colorectal Screening: Completed 06/14/15.  No longer required.   Mammogram: Completed 11/10/17. Repeat every year; No longer required. Ordered today. Pt provided with contact information and advised to call to schedule appt.   Bone Density: Completed 07/01/16. Results reflect  OSTEOPENIA. Repeat every 2 years. Bone density followed by Dr. Ouida Sills.  Lung Cancer Screening: (Low Dose CT Chest recommended if Age 58-80 years, 30 pack-year currently smoking OR have quit w/in 15years.) does not qualify.    Additional Screening:  Hepatitis C Screening: no longer required.  Vision Screening: Recommended annual ophthalmology exams for early detection of glaucoma and other disorders of the eye. Is the patient up to date with their annual eye exam?  Yes  Who is the provider or what is the name of the office in which the pt attends annual eye exams? [facility]   Dental Screening: Recommended annual dental exams for proper oral hygiene  Community Resource Referral:  CRR  required this visit?  No      Plan:      I have personally reviewed and addressed the Medicare Annual Wellness questionnaire and have noted the following in the patient's chart:  A. Medical and social history B. Use of alcohol, tobacco or illicit drugs  C. Current medications and supplements D. Functional ability and status E.  Nutritional status F.  Physical activity G. Advance directives H. List of other physicians I.  Hospitalizations, surgeries, and ER visits in previous 12 months J.  [place] such as hearing and vision if needed, cognitive and depression L. Referrals and appointments   In addition, I have reviewed and discussed with patient certain preventive protocols, quality metrics, and best practice recommendations. A written personalized care plan for preventive services as well as general preventive health recommendations were provided to patient.   Signed,  Clemetine Marker, LPN Nurse Health Advisor   Nurse Notes: Pt accompanied to visit today by her husband. MMSE completed today with score of 26/30 because they are both concerned about her memory and forgetfulness. She has not driven for the past year due to being afraid she would not remember how to get home. She gets dates and times mixed up and often cannot recall the name of things. Not being able to drive has made her very upset and frustrated but she  does have a good support system with her family. They would like to know if any medication would be helpful for her memory. Advised pt to discuss at appt on 12/10/18 with Dr. Army Melia when following up with htn and dm.

## 2018-11-22 NOTE — Patient Instructions (Addendum)
Melody Jones , Thank you for taking time to come for your Medicare Wellness Visit. I appreciate your ongoing commitment to your health goals. Please review the following plan we discussed and let me know if I can assist you in the future.   Screening recommendations/referrals: Colonoscopy: done 06/14/15 no longer required Mammogram: done 11/10/17 Please call [phone] to schedule your mammogram.  Bone Density: done 07/01/16 Recommended yearly ophthalmology/optometry visit for glaucoma screening and checkup Recommended yearly dental visit for hygiene and checkup  Vaccinations: Influenza vaccine: postponed Pneumococcal vaccine: done 11/13/14 Tdap vaccine: due - please contact us if you get a cut or scrape Shingles vaccine: Shingrix discussed. Please contact your pharmacy for coverage information.     Advanced directives: Please bring a copy of your health care power of attorney and living will to the office at your convenience.  Conditions/risks identified: Recommend decreasing pepsi to one per day.   Next appointment: 12/10/18 8:40 am Dr. Army Melia   Preventive Care 76 Years and Older, Female Preventive care refers to lifestyle choices and visits with your health care provider that can promote health and wellness. What does preventive care include?  A yearly physical exam. This is also called an annual well check.  Dental exams once or twice a year.  Routine eye exams. Ask your health care provider how often you should have your eyes checked.  Personal lifestyle choices, including:  Daily care of your teeth and gums.  Regular physical activity.  Eating a healthy diet.  Avoiding tobacco and drug use.  Limiting alcohol use.  Practicing safe sex.  Taking low-dose aspirin every day.  Taking vitamin and mineral supplements as recommended by your health care provider. What happens during an annual well check? The services and screenings done by your health care provider  during your annual well check will depend on your age, overall health, lifestyle risk factors, and family history of disease. Counseling  Your health care provider may ask you questions about your:  Alcohol use.  Tobacco use.  Drug use.  Emotional well-being.  Home and relationship well-being.  Sexual activity.  Eating habits.  History of falls.  Memory and ability to understand (cognition).  Work and work Statistician.  Reproductive health. Screening  You may have the following tests or measurements:  Height, weight, and BMI.  Blood pressure.  Lipid and cholesterol levels. These may be checked every 5 years, or more frequently if you are over 72 years old.  Skin check.  Lung cancer screening. You may have this screening every year starting at age 71 if you have a 30-pack-year history of smoking and currently smoke or have quit within the past 15 years.  Fecal occult blood test (FOBT) of the stool. You may have this test every year starting at age 22.  Flexible sigmoidoscopy or colonoscopy. You may have a sigmoidoscopy every 5 years or a colonoscopy every 10 years starting at age 58.  Hepatitis C blood test.  Hepatitis B blood test.  Sexually transmitted disease (STD) testing.  Diabetes screening. This is done by checking your blood sugar (glucose) after you have not eaten for a while (fasting). You may have this done every 1-3 years.  Bone density scan. This is done to screen for osteoporosis. You may have this done starting at age 35.  Mammogram. This may be done every 1-2 years. Talk to your health care provider about how often you should have regular mammograms. Talk with your health care provider about your test results,  treatment options, and if necessary, the need for more tests. Vaccines  Your health care provider may recommend certain vaccines, such as:  Influenza vaccine. This is recommended every year.  Tetanus, diphtheria, and acellular pertussis  (Tdap, Td) vaccine. You may need a Td booster every 10 years.  Zoster vaccine. You may need this after age 18.  Pneumococcal 13-valent conjugate (PCV13) vaccine. One dose is recommended after age 15.  Pneumococcal polysaccharide (PPSV23) vaccine. One dose is recommended after age 58. Talk to your health care provider about which screenings and vaccines you need and how often you need them. This information is not intended to replace advice given to you by your health care provider. Make sure you discuss any questions you have with your health care provider. Document Released: 12/07/2015 Document Revised: 07/30/2016 Document Reviewed: 09/11/2015 Elsevier Interactive Patient Education  2017 [place] Prevention in the Home Falls can cause injuries. They can happen to people of all ages. There are many things you can do to make your home safe and to help prevent falls. What can I do on the outside of my home?  Regularly fix the edges of walkways and driveways and fix any cracks.  Remove anything that might make you trip as you walk through a door, such as a raised step or threshold.  Trim any bushes or trees on the path to your home.  Use bright outdoor lighting.  Clear any walking paths of anything that might make someone trip, such as rocks or tools.  Regularly check to see if handrails are loose or broken. Make sure that both sides of any steps have handrails.  Any raised decks and porches should have guardrails on the edges.  Have any leaves, snow, or ice cleared regularly.  Use sand or salt on walking paths during winter.  Clean up any spills in your garage right away. This includes oil or grease spills. What can I do in the bathroom?  Use night lights.  Install grab bars by the toilet and in the tub and shower. Do not use towel bars as grab bars.  Use non-skid mats or decals in the tub or shower.  If you need to sit down in the shower, use a plastic, non-slip  stool.  Keep the floor dry. Clean up any water that spills on the floor as soon as it happens.  Remove soap buildup in the tub or shower regularly.  Attach bath mats securely with double-sided non-slip rug tape.  Do not have throw rugs and other things on the floor that can make you trip. What can I do in the bedroom?  Use night lights.  Make sure that you have a light by your bed that is easy to reach.  Do not use any sheets or blankets that are too big for your bed. They should not hang down onto the floor.  Have a firm chair that has side arms. You can use this for support while you get dressed.  Do not have throw rugs and other things on the floor that can make you trip. What can I do in the kitchen?  Clean up any spills right away.  Avoid walking on wet floors.  Keep items that you use a lot in easy-to-reach places.  If you need to reach something above you, use a strong step stool that has a grab bar.  Keep electrical cords out of the way.  Do not use floor polish or wax that makes floors  slippery. If you must use wax, use non-skid floor wax.  Do not have throw rugs and other things on the floor that can make you trip. What can I do with my stairs?  Do not leave any items on the stairs.  Make sure that there are handrails on both sides of the stairs and use them. Fix handrails that are broken or loose. Make sure that handrails are as long as the stairways.  Check any carpeting to make sure that it is firmly attached to the stairs. Fix any carpet that is loose or worn.  Avoid having throw rugs at the top or bottom of the stairs. If you do have throw rugs, attach them to the floor with carpet tape.  Make sure that you have a light switch at the top of the stairs and the bottom of the stairs. If you do not have them, ask someone to add them for you. What else can I do to help prevent falls?  Wear shoes that:  Do not have high heels.  Have rubber bottoms.  Are  comfortable and fit you well.  Are closed at the toe. Do not wear sandals.  If you use a stepladder:  Make sure that it is fully opened. Do not climb a closed stepladder.  Make sure that both sides of the stepladder are locked into place.  Ask someone to hold it for you, if possible.  Clearly mark and make sure that you can see:  Any grab bars or handrails.  First and last steps.  Where the edge of each step is.  Use tools that help you move around (mobility aids) if they are needed. These include:  Canes.  Walkers.  Scooters.  Crutches.  Turn on the lights when you go into a dark area. Replace any light bulbs as soon as they burn out.  Set up your furniture so you have a clear path. Avoid moving your furniture around.  If any of your floors are uneven, fix them.  If there are any pets around you, be aware of where they are.  Review your medicines with your doctor. Some medicines can make you feel dizzy. This can increase your chance of falling. Ask your doctor what other things that you can do to help prevent falls. This information is not intended to replace advice given to you by your health care provider. Make sure you discuss any questions you have with your health care provider. Document Released: 09/06/2009 Document Revised: 04/17/2016 Document Reviewed: 12/15/2014 Elsevier Interactive Patient Education  2017 [employer].

## 2018-12-10 ENCOUNTER — Encounter: Payer: Self-pay | Admitting: Internal Medicine

## 2018-12-10 ENCOUNTER — Ambulatory Visit: Payer: Medicare Other | Admitting: Internal Medicine

## 2018-12-10 VITALS — BP 135/82 | HR 88 | Ht 62.0 in | Wt 166.0 lb

## 2018-12-10 DIAGNOSIS — E538 Deficiency of other specified B group vitamins: Secondary | ICD-10-CM | POA: Insufficient documentation

## 2018-12-10 DIAGNOSIS — E119 Type 2 diabetes mellitus without complications: Secondary | ICD-10-CM | POA: Diagnosis not present

## 2018-12-10 DIAGNOSIS — I10 Essential (primary) hypertension: Secondary | ICD-10-CM

## 2018-12-10 DIAGNOSIS — F0150 Vascular dementia without behavioral disturbance: Secondary | ICD-10-CM | POA: Diagnosis not present

## 2018-12-10 NOTE — Progress Notes (Signed)
Date:  12/10/2018   Name:  Melody Jones   DOB:  May 15, 1941   MRN:  [id number]   Chief Complaint: Diabetes (Foot Exam. ); Hypertension; and Memory Loss  Diabetes  She presents for her follow-up diabetic visit. She has type 2 diabetes mellitus. Her disease course has been stable. Pertinent negatives for hypoglycemia include no headaches or tremors. Pertinent negatives for diabetes include no chest pain, no fatigue, no polydipsia and no polyuria. Symptoms are stable. Current diabetic treatment includes diet. An ACE inhibitor/angiotensin II receptor blocker is being taken.  Hypertension  This is a chronic problem. The problem is controlled. Pertinent negatives include no chest pain, headaches, palpitations or shortness of breath. Past treatments include ACE inhibitors.  Memory concerns - she has a slow memory - no real loss but takes a few minutes to find an answer.  She is no longer driving - one time tried to avoid the highway and had to get directions. She has been seen by Neurology and is on B12 supplements.  She is most bothered by not being able to drive.  MRI last year showed microvascular changes.  Lab Results  Component Value Date   HGBA1C 6.9 (H) 08/09/2018    Review of Systems  Constitutional: Negative for appetite change, fatigue, fever and unexpected weight change.  HENT: Negative for tinnitus and trouble swallowing.   Eyes: Negative for visual disturbance.  Respiratory: Negative for cough, chest tightness and shortness of breath.   Cardiovascular: Negative for chest pain, palpitations and leg swelling.  Gastrointestinal: Negative for abdominal pain.  Endocrine: Negative for polydipsia and polyuria.  Genitourinary: Negative for dysuria and hematuria.  Musculoskeletal: Negative for arthralgias.  Neurological: Negative for tremors, numbness and headaches.  Psychiatric/Behavioral: Negative for dysphoric mood.    Patient Active Problem List   Diagnosis Date Noted  .  Vascular dementia without behavioral disturbance ([place]) 12/03/2017  . Localized, primary osteoarthritis 06/02/2017  . Type 2 diabetes mellitus without complication, without long-term current use of insulin ([place]) 11/19/2016  . History of malignant melanoma 06/05/2015  . Chronic left hip pain 05/24/2015  . Dyslipidemia 03/15/2015  . Essential (primary) hypertension 03/15/2015  . OP (osteoporosis) 03/15/2015  . Allergic rhinitis, seasonal 03/15/2015  . Urge incontinence 03/15/2015  . Avitaminosis D 03/15/2015    Allergies  Allergen Reactions  . Latex Swelling    Past Surgical History:  Procedure Laterality Date  . BUNIONECTOMY  2013  . COLONOSCOPY N/A 06/14/2015   Procedure: COLONOSCOPY;  Surgeon: Manya Silvas, MD;  Location: [facility] ENDOSCOPY;  Service: Endoscopy;  Laterality: N/A;  . MELANOMA EXCISION Left Shoulder  . TUBAL LIGATION      Social History   Tobacco Use  . Smoking status: Former Smoker    Packs/day: 0.25    Years: 20.00    Pack years: 5.00    Types: Cigarettes    Last attempt to quit: 1988    Years since quitting: 32.0  . Smokeless tobacco: Never Used  . Tobacco comment: Smoking cessation materials not required  Substance Use Topics  . Alcohol use: Yes    Alcohol/week: 0.0 standard drinks    Frequency: Never    Comment: daily "night cap"  . Drug use: No     Medication list has been reviewed and updated.  Current Meds  Medication Sig  . aspirin EC 81 MG tablet Take 1 tablet (81 mg total) by mouth daily.  Marland Kitchen atorvastatin (LIPITOR) 10 MG tablet TAKE 1 TABLET AT BEDTIME  .  Cholecalciferol (VITAMIN D-1000 MAX ST) 1000 UNITS tablet Take 1 tablet by mouth daily.  Marland Kitchen lisinopril (PRINIVIL,ZESTRIL) 20 MG tablet TAKE 1 TABLET DAILY  . sertraline (ZOLOFT) 25 MG tablet Take 25 mg by mouth daily.  Marland Kitchen tolterodine (DETROL LA) 4 MG 24 hr capsule Take 4 mg by mouth daily.  . vitamin B-12 (CYANOCOBALAMIN) 1000 MCG tablet Take 1,000 mcg by mouth daily.    PHQ 2/9  Scores 11/22/2018 11/19/2017 11/19/2016 11/15/2015  PHQ - 2 Score 1 1 0 0  PHQ- 9 Score 3 - - -   6CIT Screen 12/10/2018 11/19/2017 11/19/2016  What Year? 0 points 0 points 0 points  What month? 0 points 0 points 0 points  What time? 0 points 0 points 0 points  Count back from 20 0 points 0 points 0 points  Months in reverse 0 points 0 points 0 points  Repeat phrase 10 points 0 points 0 points  Total Score 10 0 0     Physical Exam Vitals signs and nursing note reviewed.  Constitutional:      General: She is not in acute distress.    Appearance: She is well-developed.  HENT:     Head: Normocephalic and atraumatic.     Mouth/Throat:     Mouth: Mucous membranes are moist.  Eyes:     Pupils: Pupils are equal, round, and reactive to light.  Neck:     Musculoskeletal: Normal range of motion and neck supple.  Cardiovascular:     Rate and Rhythm: Normal rate and regular rhythm.     Pulses: Normal pulses.  Pulmonary:     Effort: Pulmonary effort is normal. No respiratory distress.  Musculoskeletal: Normal range of motion.  Lymphadenopathy:     Cervical: No cervical adenopathy.  Skin:    General: Skin is warm and dry.     Findings: No rash.  Neurological:     Mental Status: She is alert and oriented to person, place, and time.  Psychiatric:        Attention and Perception: Attention normal.        Mood and Affect: Mood normal.        Behavior: Behavior normal.        Thought Content: Thought content normal.        Cognition and Memory: Memory is impaired.     BP 135/82 (BP Location: Left Arm, Patient Position: Sitting, Cuff Size: Normal)   Pulse 88   Ht 5\' 2"  (1.575 m)   Wt 166 lb (75.3 kg)   SpO2 98%   BMI 30.36 kg/m   Assessment and Plan: 1. Type 2 diabetes mellitus without complication, without long-term current use of insulin (HCC) Check labs, continue healthy diet - Hemoglobin A1c - Comprehensive metabolic panel  2. Essential (primary)  hypertension controlled  3. Vascular dementia without behavioral disturbance ([place]) Long discussion with patient and husband - focusing on safety at home w/r to medication adherence, driving, cooking. No medication is indicated.  4. Deficiency of vitamin B12 Continue oral supplement - Vitamin B12   Partially dictated using Editor, commissioning. Any errors are unintentional.  Halina Maidens, MD [facility]  12/10/2018

## 2018-12-11 LAB — COMPREHENSIVE METABOLIC PANEL
A/G RATIO: 1.7 (ref 1.2–2.2)
ALBUMIN: 4.5 g/dL (ref 3.5–4.8)
ALT: 16 IU/L (ref 0–32)
AST: 18 IU/L (ref 0–40)
Alkaline Phosphatase: 68 IU/L (ref 39–117)
BILIRUBIN TOTAL: 0.5 mg/dL (ref 0.0–1.2)
BUN / CREAT RATIO: 15 (ref 12–28)
BUN: 11 mg/dL (ref 8–27)
CHLORIDE: 102 mmol/L (ref 96–106)
CO2: 24 mmol/L (ref 20–29)
Calcium: 9.6 mg/dL (ref 8.7–10.3)
Creatinine, Ser: 0.75 mg/dL (ref 0.57–1.00)
GFR calc non Af Amer: 78 mL/min/{1.73_m2} (ref >59)
GFR, EST AFRICAN AMERICAN: 90 mL/min/{1.73_m2} (ref >59)
GLOBULIN, TOTAL: 2.7 g/dL (ref 1.5–4.5)
Glucose: 150 mg/dL — ABNORMAL HIGH (ref 65–99)
POTASSIUM: 4.4 mmol/L (ref 3.5–5.2)
SODIUM: 142 mmol/L (ref 134–144)
TOTAL PROTEIN: 7.2 g/dL (ref 6.0–8.5)

## 2018-12-11 LAB — VITAMIN B12: Vitamin B-12: 504 pg/mL (ref 232–1245)

## 2018-12-11 LAB — HEMOGLOBIN A1C
Est. average glucose Bld gHb Est-mCnc: 151 mg/dL
Hgb A1c MFr Bld: 6.9 % — ABNORMAL HIGH (ref 4.8–5.6)

## 2019-02-09 ENCOUNTER — Other Ambulatory Visit: Payer: Self-pay | Admitting: Internal Medicine

## 2019-07-31 ENCOUNTER — Encounter: Payer: Self-pay | Admitting: Internal Medicine

## 2019-08-02 ENCOUNTER — Ambulatory Visit (INDEPENDENT_AMBULATORY_CARE_PROVIDER_SITE_OTHER): Payer: Medicare Other | Admitting: Internal Medicine

## 2019-08-02 ENCOUNTER — Other Ambulatory Visit: Payer: Self-pay

## 2019-08-02 ENCOUNTER — Encounter: Payer: Self-pay | Admitting: Internal Medicine

## 2019-08-02 VITALS — BP 122/84 | HR 84 | Ht 62.0 in | Wt 170.0 lb

## 2019-08-02 DIAGNOSIS — D485 Neoplasm of uncertain behavior of skin: Secondary | ICD-10-CM | POA: Diagnosis not present

## 2019-08-02 DIAGNOSIS — F0150 Vascular dementia without behavioral disturbance: Secondary | ICD-10-CM

## 2019-08-02 DIAGNOSIS — Z1231 Encounter for screening mammogram for malignant neoplasm of breast: Secondary | ICD-10-CM

## 2019-08-02 DIAGNOSIS — E1169 Type 2 diabetes mellitus with other specified complication: Secondary | ICD-10-CM

## 2019-08-02 DIAGNOSIS — Z23 Encounter for immunization: Secondary | ICD-10-CM | POA: Diagnosis not present

## 2019-08-02 DIAGNOSIS — I10 Essential (primary) hypertension: Secondary | ICD-10-CM | POA: Diagnosis not present

## 2019-08-02 DIAGNOSIS — E785 Hyperlipidemia, unspecified: Secondary | ICD-10-CM

## 2019-08-02 DIAGNOSIS — Z0000 Encounter for general adult medical examination without abnormal findings: Secondary | ICD-10-CM | POA: Diagnosis not present

## 2019-08-02 DIAGNOSIS — E118 Type 2 diabetes mellitus with unspecified complications: Secondary | ICD-10-CM | POA: Diagnosis not present

## 2019-08-02 LAB — POCT URINALYSIS DIPSTICK
Bilirubin, UA: NEGATIVE
Blood, UA: NEGATIVE
Glucose, UA: NEGATIVE
Ketones, UA: NEGATIVE
Leukocytes, UA: NEGATIVE
Nitrite, UA: NEGATIVE
Protein, UA: NEGATIVE
Spec Grav, UA: 1.01 (ref 1.010–1.025)
Urobilinogen, UA: 0.2 E.U./dL
pH, UA: 5 (ref 5.0–8.0)

## 2019-08-02 MED ORDER — ATORVASTATIN CALCIUM 10 MG PO TABS: 10.0000 mg | ORAL_TABLET | Freq: Every day | ORAL | 4 refills | Status: DC

## 2019-08-02 MED ORDER — LISINOPRIL 20 MG PO TABS: 20.0000 mg | ORAL_TABLET | Freq: Every day | ORAL | 4 refills | Status: DC

## 2019-08-02 NOTE — Patient Instructions (Addendum)
Schedule Diabetic Eye Exam  Schedule Mammogram  Restart B12 vitamins daily  1000 mg   Someone will call you about a Dermatology appointment

## 2019-08-02 NOTE — Progress Notes (Deleted)
    Date:  08/02/2019   Name:  Melody Jones   DOB:  05-Jul-1941   MRN:  [id number]   Chief Complaint: No chief complaint on file. Melody Jones is a 78 y.o. female who presents today for her Complete Annual Exam. She feels {DESC; WELL/FAIRLY WELL/POORLY:18703}. She reports exercising ***. She reports she is sleeping {DESC; WELL/FAIRLY WELL/POORLY:18703}.   Mammogram 10/2017 Colonoscopy - aged out  Diabetes She presents for her follow-up diabetic visit. She has type 2 diabetes mellitus.  Hypertension  Hyperlipidemia   Lab Results  Component Value Date   HGBA1C 6.9 (H) 12/10/2018   Lab Results  Component Value Date   CREATININE 0.75 12/10/2018   BUN 11 12/10/2018   NA 142 12/10/2018   K 4.4 12/10/2018   CL 102 12/10/2018   CO2 24 12/10/2018   Lab Results  Component Value Date   CHOL 181 12/03/2017   HDL 62 12/03/2017   LDLCALC 97 12/03/2017   TRIG 108 12/03/2017   CHOLHDL 2.9 12/03/2017    Review of Systems  Patient Active Problem List   Diagnosis Date Noted  . Deficiency of vitamin B12 12/10/2018  . Vascular dementia without behavioral disturbance ([place]) 12/03/2017  . Localized, primary osteoarthritis 06/02/2017  . Type II diabetes mellitus with complication ([place]) [id number]  . History of malignant melanoma 06/05/2015  . Chronic left hip pain 05/24/2015  . Hyperlipidemia associated with type 2 diabetes mellitus ([place]) 03/15/2015  . Essential (primary) hypertension 03/15/2015  . OP (osteoporosis) 03/15/2015  . Allergic rhinitis, seasonal 03/15/2015  . Urge incontinence 03/15/2015  . Avitaminosis D 03/15/2015    Allergies  Allergen Reactions  . Latex Swelling    Past Surgical History:  Procedure Laterality Date  . BUNIONECTOMY  2013  . COLONOSCOPY N/A 06/14/2015   Procedure: COLONOSCOPY;  Surgeon: Manya Silvas, MD;  Location: [facility] ENDOSCOPY;  Service: Endoscopy;  Laterality: N/A;  . MELANOMA EXCISION Left Shoulder  . TUBAL LIGATION      Social  History   Tobacco Use  . Smoking status: Former Smoker    Packs/day: 0.25    Years: 20.00    Pack years: 5.00    Types: Cigarettes    Quit date: 1988    Years since quitting: 32.7  . Smokeless tobacco: Never Used  . Tobacco comment: Smoking cessation materials not required  Substance Use Topics  . Alcohol use: Yes    Alcohol/week: 0.0 standard drinks    Frequency: Never    Comment: daily "night cap"  . Drug use: No     Medication list has been reviewed and updated.  No outpatient medications have been marked as taking for the 08/02/19 encounter (Appointment) with Melody Hess, MD.    [facility] 2/9 Scores 11/22/2018 11/19/2017 11/19/2016 11/15/2015  PHQ - 2 Score 1 1 0 0  PHQ- 9 Score 3 - - -    BP Readings from Last 3 Encounters:  12/10/18 135/82  11/22/18 (!) 158/86  08/09/18 (!) 142/82    Physical Exam  Wt Readings from Last 3 Encounters:  12/10/18 166 lb (75.3 kg)  11/22/18 166 lb 12.8 oz (75.7 kg)  08/09/18 166 lb (75.3 kg)    There were no vitals taken for this visit.  Assessment and Plan:

## 2019-08-02 NOTE — Progress Notes (Signed)
Date:  08/02/2019   Name:  Melody Jones   DOB:  1941-03-27   MRN:  [id number]   Chief Complaint: Annual Exam (High dose flu shot.) and Diabetes (Document for [facility]>) Melody Jones is a 78 y.o. female who presents today for her Complete Annual Exam. She feels fairly well. She reports exercising rarely. She reports she is sleeping fairly well. She denies any breast issues.  She is sad that she can no longer drive due to mild cognitive impairment.  Mammogram  10/2017 Pneumonia vaccines up to date  Diabetes She presents for her follow-up diabetic visit. She has type 2 diabetes mellitus. Her disease course has been stable. Pertinent negatives for hypoglycemia include no dizziness, headaches, nervousness/anxiousness or tremors. Pertinent negatives for diabetes include no chest pain, no fatigue, no polydipsia and no polyuria. Current diabetic treatment includes diet. She is compliant with treatment most of the time. An ACE inhibitor/angiotensin II receptor blocker is being taken. Eye exam is current.  Hypertension This is a chronic problem. The problem is controlled. Pertinent negatives include no chest pain, headaches, palpitations or shortness of breath. Past treatments include ACE inhibitors. The current treatment provides significant improvement.  Hyperlipidemia The problem is controlled. Pertinent negatives include no chest pain or shortness of breath. Current antihyperlipidemic treatment includes statins. The current treatment provides significant improvement of lipids.  [facility] - followed by Neurology.  Last visit June 2020.  No medication was added.  They did continue her on B12 and vitamin D supplements. Depression - she was felt to be slightly more depressed at times on her last neurology appt.  They had her on zofoft 25 mg and increased it to 50 mg but she stopped it due to an episode at the beach.  Lab Results  Component Value Date   HGBA1C 6.9 (H) 12/10/2018   Lab Results  Component  Value Date   CREATININE 0.75 12/10/2018   BUN 11 12/10/2018   NA 142 12/10/2018   K 4.4 12/10/2018   CL 102 12/10/2018   CO2 24 12/10/2018   Lab Results  Component Value Date   CHOL 181 12/03/2017   HDL 62 12/03/2017   LDLCALC 97 12/03/2017   TRIG 108 12/03/2017   CHOLHDL 2.9 12/03/2017     Review of Systems  Constitutional: Negative for chills, fatigue and fever.  HENT: Negative for congestion, hearing loss, tinnitus, trouble swallowing and voice change.   Eyes: Negative for visual disturbance.  Respiratory: Negative for cough, chest tightness, shortness of breath and wheezing.   Cardiovascular: Negative for chest pain, palpitations and leg swelling.  Gastrointestinal: Negative for abdominal pain, constipation, diarrhea and vomiting.  Endocrine: Negative for polydipsia and polyuria.  Genitourinary: Negative for dysuria, frequency, genital sores, vaginal bleeding and vaginal discharge.  Musculoskeletal: Negative for arthralgias, gait problem and joint swelling.  Skin: Negative for color change and rash.  Neurological: Negative for dizziness, tremors, light-headedness and headaches.  Hematological: Negative for adenopathy. Does not bruise/bleed easily.  Psychiatric/Behavioral: Negative for dysphoric mood and sleep disturbance. The patient is not nervous/anxious.     Patient Active Problem List   Diagnosis Date Noted  . Deficiency of vitamin B12 12/10/2018  . Vascular dementia without behavioral disturbance ([place]) 12/03/2017  . Localized, primary osteoarthritis 06/02/2017  . Type II diabetes mellitus with complication ([place]) [id number]  . History of malignant melanoma 06/05/2015  . Chronic left hip pain 05/24/2015  . Hyperlipidemia associated with type 2 diabetes mellitus ([place]) 03/15/2015  . Essential (primary)  hypertension 03/15/2015  . OP (osteoporosis) 03/15/2015  . Allergic rhinitis, seasonal 03/15/2015  . Urge incontinence 03/15/2015  . Avitaminosis D 03/15/2015     Allergies  Allergen Reactions  . Latex Swelling    Past Surgical History:  Procedure Laterality Date  . BUNIONECTOMY  2013  . COLONOSCOPY N/A 06/14/2015   Procedure: COLONOSCOPY;  Surgeon: Manya Silvas, MD;  Location: [facility] ENDOSCOPY;  Service: Endoscopy;  Laterality: N/A;  . MELANOMA EXCISION Left Shoulder  . TUBAL LIGATION      Social History   Tobacco Use  . Smoking status: Former Smoker    Packs/day: 0.25    Years: 20.00    Pack years: 5.00    Types: Cigarettes    Quit date: 1988    Years since quitting: 32.7  . Smokeless tobacco: Never Used  . Tobacco comment: Smoking cessation materials not required  Substance Use Topics  . Alcohol use: Yes    Alcohol/week: 0.0 standard drinks    Frequency: Never    Comment: daily "night cap"  . Drug use: No     Medication list has been reviewed and updated.  Current Meds  Medication Sig  . aspirin EC 81 MG tablet Take 1 tablet (81 mg total) by mouth daily.  Marland Kitchen atorvastatin (LIPITOR) 10 MG tablet TAKE 1 TABLET AT BEDTIME  . Cholecalciferol (VITAMIN D-1000 MAX ST) 1000 UNITS tablet Take 1 tablet by mouth daily.  Marland Kitchen lisinopril (PRINIVIL,ZESTRIL) 20 MG tablet TAKE 1 TABLET DAILY  . tolterodine (DETROL LA) 4 MG 24 hr capsule Take 4 mg by mouth daily.    PHQ 2/9 Scores 08/02/2019 11/22/2018 11/19/2017 11/19/2016  PHQ - 2 Score 0 1 1 0  PHQ- 9 Score - 3 - -    BP Readings from Last 3 Encounters:  08/02/19 122/84  12/10/18 135/82  11/22/18 (!) 158/86    Physical Exam Vitals signs and nursing note reviewed.  Constitutional:      General: She is not in acute distress.    Appearance: She is well-developed.  HENT:     Head: Normocephalic and atraumatic.     Right Ear: Tympanic membrane and ear canal normal.     Left Ear: Tympanic membrane and ear canal normal.     Nose:     Right Sinus: No maxillary sinus tenderness.     Left Sinus: No maxillary sinus tenderness.  Eyes:     General: No scleral icterus.       Right eye:  No discharge.        Left eye: No discharge.     Conjunctiva/sclera: Conjunctivae normal.  Neck:     Musculoskeletal: Normal range of motion. No erythema.     Thyroid: No thyromegaly.     Vascular: No carotid bruit.  Cardiovascular:     Rate and Rhythm: Normal rate and regular rhythm.     Pulses: Normal pulses.     Heart sounds: Normal heart sounds.  Pulmonary:     Effort: Pulmonary effort is normal. No respiratory distress.     Breath sounds: No wheezing.  Chest:     Breasts:        Right: No mass, nipple discharge, skin change or tenderness.        Left: No mass, nipple discharge, skin change or tenderness.  Abdominal:     General: Bowel sounds are normal.     Palpations: Abdomen is soft.     Tenderness: There is no abdominal tenderness.  Musculoskeletal: Normal range  of motion.  Lymphadenopathy:     Cervical: No cervical adenopathy.  Skin:    General: Skin is warm and dry.     Findings: No rash.     Comments: Multiple dark raised and flat lesions Cystic lesion right anterior chest  Neurological:     Mental Status: She is alert and oriented to person, place, and time.     Cranial Nerves: No cranial nerve deficit.     Sensory: No sensory deficit.     Deep Tendon Reflexes: Reflexes are normal and symmetric.  Psychiatric:        Attention and Perception: Attention normal.        Mood and Affect: Mood normal.        Speech: Speech normal.        Behavior: Behavior normal.        Thought Content: Thought content normal. Thought content does not include suicidal plan.        Judgment: Judgment normal.     Comments: Tearful discussing inability to drive but otherwise normal affect     Wt Readings from Last 3 Encounters:  08/02/19 170 lb (77.1 kg)  12/10/18 166 lb (75.3 kg)  11/22/18 166 lb 12.8 oz (75.7 kg)    BP 122/84   Pulse 84   Ht 5\' 2"  (1.575 m)   Wt 170 lb (77.1 kg)   SpO2 95%   BMI 31.09 kg/m   Assessment and Plan: 1. Annual physical exam Normal exam  except for weight Pt will continue regular exercise walking - POCT urinalysis dipstick  2. Encounter for screening mammogram for breast cancer To be scheduled at [facility]; Future  3. Essential (primary) hypertension Clinically stable exam with well controlled BP.   Tolerating medications, lisinopril 20 mg, without side effects at this time. Pt to continue current regimen and low sodium diet; benefits of regular exercise as able discussed. - CBC with Differential/Platelet - TSH - lisinopril (ZESTRIL) 20 MG tablet; Take 1 tablet (20 mg total) by mouth daily.  Dispense: 90 tablet; Refill: 4  4. Hyperlipidemia associated with type 2 diabetes mellitus ([place]) Tolerating statin medication without side effects at this time LDL is not at goal of < 70 on current dose Continue same therapy without change at this time - consider dose increase - Lipid panel - atorvastatin (LIPITOR) 10 MG tablet; Take 1 tablet (10 mg total) by mouth at bedtime.  Dispense: 90 tablet; Refill: 4  5. Type II diabetes mellitus with complication (HCC) Pt is treated with diet alone; she does not check her BS She follows a healthy diet and exercises regularly - Comprehensive metabolic panel - Hemoglobin A1c  6. Neoplasm of uncertain behavior of skin - Ambulatory referral to Dermatology  7. Vascular dementia without behavioral disturbance (HCC) Clinically stable - Being followed by Neurology Not currently on medications She stopped Zoloft because of bitter taste   Partially dictated using Editor, commissioning. Any errors are unintentional.  Halina Maidens, MD [facility]  08/02/2019

## 2019-08-03 LAB — CBC WITH DIFFERENTIAL/PLATELET
Basophils Absolute: 0.1 10*3/uL (ref 0.0–0.2)
Basos: 1 %
EOS (ABSOLUTE): 0.1 10*3/uL (ref 0.0–0.4)
Eos: 2 %
Hematocrit: 44.9 % (ref 34.0–46.6)
Hemoglobin: 15.4 g/dL (ref 11.1–15.9)
Immature Grans (Abs): 0 10*3/uL (ref 0.0–0.1)
Immature Granulocytes: 0 %
Lymphocytes Absolute: 1.7 10*3/uL (ref 0.7–3.1)
Lymphs: 26 %
MCH: 30.4 pg (ref 26.6–33.0)
MCHC: 34.3 g/dL (ref 31.5–35.7)
MCV: 89 fL (ref 79–97)
Monocytes Absolute: 0.4 10*3/uL (ref 0.1–0.9)
Monocytes: 6 %
Neutrophils Absolute: 4.4 10*3/uL (ref 1.4–7.0)
Neutrophils: 65 %
Platelets: 288 10*3/uL (ref 150–450)
RBC: 5.07 x10E6/uL (ref 3.77–5.28)
RDW: 13.1 % (ref 11.7–15.4)
WBC: 6.7 10*3/uL (ref 3.4–10.8)

## 2019-08-03 LAB — LIPID PANEL
Chol/HDL Ratio: 3.2 ratio (ref 0.0–4.4)
Cholesterol, Total: 240 mg/dL — ABNORMAL HIGH (ref 100–199)
HDL: 74 mg/dL (ref >39)
LDL Chol Calc (NIH): 141 mg/dL — ABNORMAL HIGH (ref 0–99)
Triglycerides: 141 mg/dL (ref 0–149)
VLDL Cholesterol Cal: 25 mg/dL (ref 5–40)

## 2019-08-03 LAB — COMPREHENSIVE METABOLIC PANEL
ALT: 17 IU/L (ref 0–32)
AST: 17 IU/L (ref 0–40)
Albumin/Globulin Ratio: 1.6 (ref 1.2–2.2)
Albumin: 4.4 g/dL (ref 3.7–4.7)
Alkaline Phosphatase: 72 IU/L (ref 39–117)
BUN/Creatinine Ratio: 13 (ref 12–28)
BUN: 10 mg/dL (ref 8–27)
Bilirubin Total: 0.4 mg/dL (ref 0.0–1.2)
CO2: 24 mmol/L (ref 20–29)
Calcium: 9.8 mg/dL (ref 8.7–10.3)
Chloride: 101 mmol/L (ref 96–106)
Creatinine, Ser: 0.76 mg/dL (ref 0.57–1.00)
GFR calc Af Amer: 88 mL/min/{1.73_m2} (ref >59)
GFR calc non Af Amer: 76 mL/min/{1.73_m2} (ref >59)
Globulin, Total: 2.7 g/dL (ref 1.5–4.5)
Glucose: 165 mg/dL — ABNORMAL HIGH (ref 65–99)
Potassium: 4.1 mmol/L (ref 3.5–5.2)
Sodium: 140 mmol/L (ref 134–144)
Total Protein: 7.1 g/dL (ref 6.0–8.5)

## 2019-08-03 LAB — HEMOGLOBIN A1C
Est. average glucose Bld gHb Est-mCnc: 157 mg/dL
Hgb A1c MFr Bld: 7.1 % — ABNORMAL HIGH (ref 4.8–5.6)

## 2019-08-03 LAB — TSH: TSH: 2.53 u[IU]/mL (ref 0.450–4.500)

## 2019-10-15 ENCOUNTER — Other Ambulatory Visit: Payer: Self-pay | Admitting: Internal Medicine

## 2019-10-15 DIAGNOSIS — E785 Hyperlipidemia, unspecified: Secondary | ICD-10-CM

## 2019-10-15 DIAGNOSIS — E1169 Type 2 diabetes mellitus with other specified complication: Secondary | ICD-10-CM

## 2019-12-06 ENCOUNTER — Ambulatory Visit: Payer: Medicare Other | Admitting: Internal Medicine

## 2019-12-06 NOTE — Progress Notes (Deleted)
Date:  12/06/2019   Name:  Melody Jones   DOB:  1941/09/01   MRN:  [id number]   Chief Complaint: No chief complaint on file. Eye exam is overdue. Pt did not schedule mammogram ordered in September. Diabetes She presents for her follow-up diabetic visit. She has type 2 diabetes mellitus. Her disease course has been stable. Current diabetic treatment includes diet. She is compliant with treatment most of the time. Her weight is stable.  Hypertension This is a chronic problem. The problem is unchanged. The problem is controlled. Past treatments include ACE inhibitors.  Hyperlipidemia This is a chronic problem. The problem is resistant. Current antihyperlipidemic treatment includes statins (lipitor doubled last visit to 20 mg).    Lab Results  Component Value Date   CREATININE 0.76 08/02/2019   BUN 10 08/02/2019   NA 140 08/02/2019   K 4.1 08/02/2019   CL 101 08/02/2019   CO2 24 08/02/2019   Lab Results  Component Value Date   CHOL 240 (H) 08/02/2019   HDL 74 08/02/2019   LDLCALC 141 (H) 08/02/2019   TRIG 141 08/02/2019   CHOLHDL 3.2 08/02/2019   Lab Results  Component Value Date   TSH 2.530 08/02/2019   Lab Results  Component Value Date   HGBA1C 7.1 (H) 08/02/2019     Review of Systems  Patient Active Problem List   Diagnosis Date Noted  . Deficiency of vitamin B12 12/10/2018  . Vascular dementia without behavioral disturbance ([place]) 12/03/2017  . Localized, primary osteoarthritis 06/02/2017  . Type II diabetes mellitus with complication ([place]) [id number]  . History of malignant melanoma 06/05/2015  . Chronic left hip pain 05/24/2015  . Hyperlipidemia associated with type 2 diabetes mellitus ([place]) 03/15/2015  . Essential (primary) hypertension 03/15/2015  . OP (osteoporosis) 03/15/2015  . Allergic rhinitis, seasonal 03/15/2015  . Urge incontinence 03/15/2015  . Avitaminosis D 03/15/2015    Allergies  Allergen Reactions  . Latex Swelling    Past  Surgical History:  Procedure Laterality Date  . BUNIONECTOMY  2013  . COLONOSCOPY N/A 06/14/2015   Procedure: COLONOSCOPY;  Surgeon: Manya Silvas, MD;  Location: [facility] ENDOSCOPY;  Service: Endoscopy;  Laterality: N/A;  . MELANOMA EXCISION Left Shoulder  . TUBAL LIGATION      Social History   Tobacco Use  . Smoking status: Former Smoker    Packs/day: 0.25    Years: 20.00    Pack years: 5.00    Types: Cigarettes    Quit date: 1988    Years since quitting: 33.0  . Smokeless tobacco: Never Used  . Tobacco comment: Smoking cessation materials not required  Substance Use Topics  . Alcohol use: Yes    Alcohol/week: 0.0 standard drinks    Comment: daily "night cap"  . Drug use: No     Medication list has been reviewed and updated.  No outpatient medications have been marked as taking for the 12/06/19 encounter (Appointment) with Glean Hess, MD.    [facility] 2/9 Scores 08/02/2019 11/22/2018 11/19/2017 11/19/2016  PHQ - 2 Score 0 1 1 0  PHQ- 9 Score - 3 - -    BP Readings from Last 3 Encounters:  08/02/19 122/84  12/10/18 135/82  11/22/18 (!) 158/86    Physical Exam  Wt Readings from Last 3 Encounters:  08/02/19 170 lb (77.1 kg)  12/10/18 166 lb (75.3 kg)  11/22/18 166 lb 12.8 oz (75.7 kg)    There were no vitals taken for this  visit.  Assessment and Plan:

## 2020-01-02 ENCOUNTER — Ambulatory Visit: Payer: Medicare Other

## 2020-01-03 ENCOUNTER — Other Ambulatory Visit: Payer: Self-pay

## 2020-01-03 ENCOUNTER — Encounter: Payer: Self-pay | Admitting: Internal Medicine

## 2020-01-03 ENCOUNTER — Ambulatory Visit: Payer: Medicare Other | Admitting: Internal Medicine

## 2020-01-03 VITALS — BP 132/78 | HR 85 | Temp 98.0°F | Ht 62.0 in | Wt 167.0 lb

## 2020-01-03 DIAGNOSIS — E1169 Type 2 diabetes mellitus with other specified complication: Secondary | ICD-10-CM | POA: Diagnosis not present

## 2020-01-03 DIAGNOSIS — F0150 Vascular dementia without behavioral disturbance: Secondary | ICD-10-CM

## 2020-01-03 DIAGNOSIS — E118 Type 2 diabetes mellitus with unspecified complications: Secondary | ICD-10-CM | POA: Diagnosis not present

## 2020-01-03 DIAGNOSIS — E785 Hyperlipidemia, unspecified: Secondary | ICD-10-CM

## 2020-01-03 DIAGNOSIS — I10 Essential (primary) hypertension: Secondary | ICD-10-CM

## 2020-01-03 DIAGNOSIS — Z85820 Personal history of malignant melanoma of skin: Secondary | ICD-10-CM

## 2020-01-03 MED ORDER — ATORVASTATIN CALCIUM 20 MG PO TABS: 20.0000 mg | ORAL_TABLET | Freq: Every day | ORAL | 1 refills | Status: DC

## 2020-01-03 MED ORDER — SERTRALINE HCL 50 MG PO TABS: 50.0000 mg | ORAL_TABLET | Freq: Every day | ORAL | 1 refills | Status: DC

## 2020-01-03 NOTE — Progress Notes (Signed)
Date:  01/03/2020   Name:  Melody Jones   DOB:  10/26/1941   MRN:  [id number]  Pt is accompanied by her husband Broadus John who provides most of the history. Chief Complaint: Diabetes (Follow up.), Hypertension, and Hyperlipidemia (not taking choles. medication havent taken in 1.5-2 years)  Diabetes She presents for her follow-up diabetic visit. She has type 2 diabetes mellitus. Her disease course has been stable. Hypoglycemia symptoms include confusion. Pertinent negatives for hypoglycemia include no headaches or tremors. Pertinent negatives for diabetes include no chest pain, no fatigue, no polydipsia and no polyuria. Current diabetic treatment includes diet. She is compliant with treatment most of the time. An ACE inhibitor/angiotensin II receptor blocker is being taken. Eye exam is not current.  Hypertension This is a chronic problem. Pertinent negatives include no chest pain, headaches, palpitations or shortness of breath. Past treatments include ACE inhibitors (but states she is not taking any medication).  Hyperlipidemia This is a chronic problem. The problem is uncontrolled. Pertinent negatives include no chest pain or shortness of breath. Treatments tried: previously on lipitor 10 mg - advised to increase to 20 mg but pt states she is not taking anything.  Memory loss - seen by [facility].  They prescribed zoloft 25 mg --> 50 mg in April.   Her husband says that her memory is poor but not really changed recently.  He was not aware that she was supposed to be taking medications.  She admits that she does not know what she should be taking.  She does not believe that she had any issues with medication previously.  Lab Results  Component Value Date   CREATININE 0.76 08/02/2019   BUN 10 08/02/2019   NA 140 08/02/2019   K 4.1 08/02/2019   CL 101 08/02/2019   CO2 24 08/02/2019   Lab Results  Component Value Date   CHOL 240 (H) 08/02/2019   HDL 74 08/02/2019   LDLCALC 141 (H)  08/02/2019   TRIG 141 08/02/2019   CHOLHDL 3.2 08/02/2019   Lab Results  Component Value Date   TSH 2.530 08/02/2019   Lab Results  Component Value Date   HGBA1C 7.1 (H) 08/02/2019     Review of Systems  Constitutional: Negative for appetite change, fatigue, fever and unexpected weight change.  HENT: Negative for tinnitus and trouble swallowing.   Eyes: Negative for visual disturbance.  Respiratory: Negative for cough, chest tightness and shortness of breath.   Cardiovascular: Negative for chest pain, palpitations and leg swelling.  Gastrointestinal: Negative for abdominal pain.  Endocrine: Negative for polydipsia and polyuria.  Genitourinary: Positive for frequency and urgency. Negative for dysuria and hematuria.  Musculoskeletal: Negative for arthralgias.  Neurological: Negative for tremors, numbness and headaches.  Psychiatric/Behavioral: Positive for confusion, decreased concentration and dysphoric mood (tearful at times). Negative for sleep disturbance.    Patient Active Problem List   Diagnosis Date Noted  . Deficiency of vitamin B12 12/10/2018  . Vascular dementia without behavioral disturbance ([place]) 12/03/2017  . Localized, primary osteoarthritis 06/02/2017  . Type II diabetes mellitus with complication ([place]) [id number]  . History of malignant melanoma 06/05/2015  . Chronic left hip pain 05/24/2015  . Hyperlipidemia associated with type 2 diabetes mellitus ([place]) 03/15/2015  . Essential (primary) hypertension 03/15/2015  . OP (osteoporosis) 03/15/2015  . Allergic rhinitis, seasonal 03/15/2015  . Urge incontinence 03/15/2015  . Avitaminosis D 03/15/2015    Allergies  Allergen Reactions  . Latex Swelling    Past  Surgical History:  Procedure Laterality Date  . BUNIONECTOMY  2013  . COLONOSCOPY N/A 06/14/2015   Procedure: COLONOSCOPY;  Surgeon: Manya Silvas, MD;  Location: [facility] ENDOSCOPY;  Service: Endoscopy;  Laterality: N/A;  . MELANOMA EXCISION Left  Shoulder  . TUBAL LIGATION      Social History   Tobacco Use  . Smoking status: Former Smoker    Packs/day: 0.25    Years: 20.00    Pack years: 5.00    Types: Cigarettes    Quit date: 1988    Years since quitting: 33.1  . Smokeless tobacco: Never Used  . Tobacco comment: Smoking cessation materials not required  Substance Use Topics  . Alcohol use: Yes    Alcohol/week: 0.0 standard drinks    Comment: daily "night cap"  . Drug use: No     Medication list has been reviewed and updated.  Current Meds  Medication Sig  . Cholecalciferol (VITAMIN D-1000 MAX ST) 1000 UNITS tablet Take 1 tablet by mouth daily.    PHQ 2/9 Scores 01/03/2020 08/02/2019 11/22/2018 11/19/2017  PHQ - 2 Score 0 0 1 1  PHQ- 9 Score 1 - 3 -    BP Readings from Last 3 Encounters:  01/03/20 132/78  08/02/19 122/84  12/10/18 135/82    Physical Exam Constitutional:      Appearance: Normal appearance.  Eyes:     Pupils: Pupils are equal, round, and reactive to light.  Neck:     Vascular: No carotid bruit.  Cardiovascular:     Rate and Rhythm: Normal rate and regular rhythm.     Pulses: Normal pulses.  Pulmonary:     Effort: Pulmonary effort is normal.     Breath sounds: Normal breath sounds. No wheezing or rhonchi.  Musculoskeletal:     Cervical back: Normal range of motion.     Right lower leg: No edema.     Left lower leg: No edema.  Lymphadenopathy:     Cervical: No cervical adenopathy.  Skin:    General: Skin is warm and dry.  Neurological:     General: No focal deficit present.     Mental Status: She is alert.     Sensory: Sensation is intact.     Motor: Motor function is intact.     Comments: Moderate memory loss noted - pt is otherwise alert and conversant, follows the conversation well and contributes frequently.  Psychiatric:        Attention and Perception: Attention normal.        Mood and Affect: Affect is tearful.        Speech: Speech normal.        Cognition and Memory:  Memory is impaired.     Wt Readings from Last 3 Encounters:  01/03/20 167 lb (75.8 kg)  08/02/19 170 lb (77.1 kg)  12/10/18 166 lb (75.3 kg)    BP 132/78   Pulse 85   Temp 98 F (36.7 C) (Oral)   Ht 5\' 2"  (1.575 m)   Wt 167 lb (75.8 kg)   SpO2 95%   BMI 30.54 kg/m   Assessment and Plan: 1. Essential (primary) hypertension BP is normal today - supposedly off of medication for months Husband will check at home to see what she has been taking, if anything For now, will discontinue lisinopril Follow up in 3 months - Comprehensive metabolic panel - TSH + free T4  2. Type II diabetes mellitus with complication (HCC) This has been diet controlled  so hopefully there has not been a big change Will check labs and then advise - Hemoglobin A1c  3. Hyperlipidemia associated with type 2 diabetes mellitus ([place]) Recommend resuming Lipitor at 20 mg per day for primary prevention Will check labs for comparison to previous to help determine if she may have been taking lipitor and just does not remember - Lipid panel - atorvastatin (LIPITOR) 20 MG tablet; Take 1 tablet (20 mg total) by mouth at bedtime.  Dispense: 90 tablet; Refill: 1  4. Vascular dementia without behavioral disturbance ([place]) I have asked her husband to take over her medication management and assure that she gets what is prescribed She is tearful today and Neurology had recommended that she take sertraline.  Will prescribe that today and reassess at next visit. - sertraline (ZOLOFT) 50 MG tablet; Take 1 tablet (50 mg total) by mouth at bedtime.  Dispense: 90 tablet; Refill: 1   Partially dictated using Editor, commissioning. Any errors are unintentional.  Halina Maidens, MD [facility]  01/03/2020

## 2020-01-03 NOTE — Patient Instructions (Addendum)
We have signed you up for the Covid vaccine through [facility] health.  Someone will be calling you to set up an appointment.   Make sure that she takes a baby aspirin 81 mg daily.

## 2020-01-04 LAB — LIPID PANEL
Chol/HDL Ratio: 3.4 ratio (ref 0.0–4.4)
Cholesterol, Total: 227 mg/dL — ABNORMAL HIGH (ref 100–199)
HDL: 66 mg/dL (ref >39)
LDL Chol Calc (NIH): 115 mg/dL — ABNORMAL HIGH (ref 0–99)
Triglycerides: 267 mg/dL — ABNORMAL HIGH (ref 0–149)
VLDL Cholesterol Cal: 46 mg/dL — ABNORMAL HIGH (ref 5–40)

## 2020-01-04 LAB — COMPREHENSIVE METABOLIC PANEL
ALT: 12 IU/L (ref 0–32)
AST: 14 IU/L (ref 0–40)
Albumin/Globulin Ratio: 1.7 (ref 1.2–2.2)
Albumin: 4.5 g/dL (ref 3.7–4.7)
Alkaline Phosphatase: 72 IU/L (ref 39–117)
BUN/Creatinine Ratio: 15 (ref 12–28)
BUN: 12 mg/dL (ref 8–27)
Bilirubin Total: 0.3 mg/dL (ref 0.0–1.2)
CO2: 25 mmol/L (ref 20–29)
Calcium: 9.4 mg/dL (ref 8.7–10.3)
Chloride: 103 mmol/L (ref 96–106)
Creatinine, Ser: 0.8 mg/dL (ref 0.57–1.00)
GFR calc Af Amer: 82 mL/min/{1.73_m2} (ref >59)
GFR calc non Af Amer: 71 mL/min/{1.73_m2} (ref >59)
Globulin, Total: 2.6 g/dL (ref 1.5–4.5)
Glucose: 201 mg/dL — ABNORMAL HIGH (ref 65–99)
Potassium: 3.8 mmol/L (ref 3.5–5.2)
Sodium: 142 mmol/L (ref 134–144)
Total Protein: 7.1 g/dL (ref 6.0–8.5)

## 2020-01-04 LAB — HEMOGLOBIN A1C
Est. average glucose Bld gHb Est-mCnc: 163 mg/dL
Hgb A1c MFr Bld: 7.3 % — ABNORMAL HIGH (ref 4.8–5.6)

## 2020-01-04 LAB — TSH+FREE T4
Free T4: 1.08 ng/dL (ref 0.82–1.77)
TSH: 2.26 u[IU]/mL (ref 0.450–4.500)

## 2020-04-02 ENCOUNTER — Ambulatory Visit: Payer: Medicare Other

## 2020-04-02 ENCOUNTER — Ambulatory Visit: Payer: Medicare Other | Admitting: Internal Medicine

## 2020-05-07 IMAGING — MR MR HEAD W/O CM
3 series · 48 of 48 positions shown · non-contrast
Comparison: None.

CLINICAL DATA: Memory loss

EXAM:
MRI HEAD WITHOUT CONTRAST
TECHNIQUE: Multiplanar, multiecho pulse sequences of the brain and surrounding
structures were obtained without intravenous contrast.

[Series 52: nqsegcb_sc_cor · 1.00mm/px · 17 of 238 slices shown]
[im 1/238]
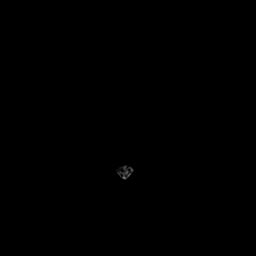
[im 15/238]
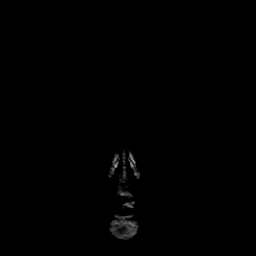
[im 30/238]
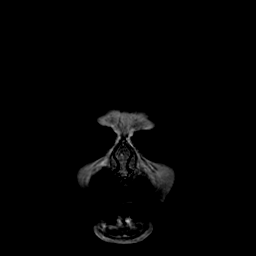
[im 45/238]
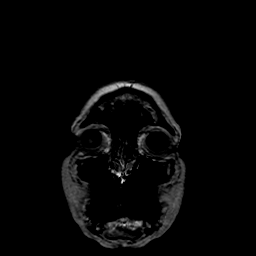
[im 60/238]
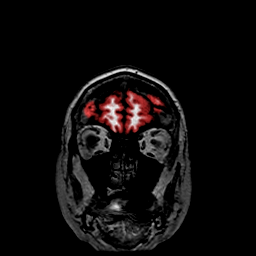
[im 75/238]
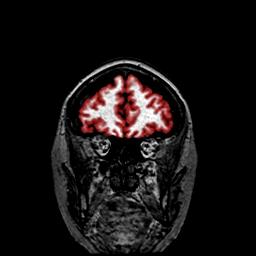
[im 89/238]
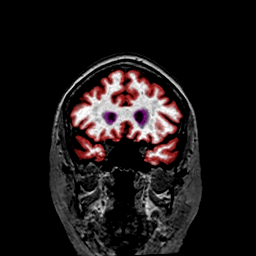
[im 104/238]
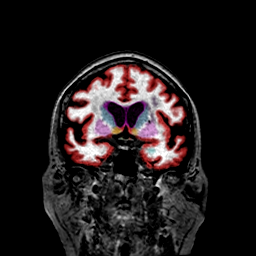
[im 119/238]
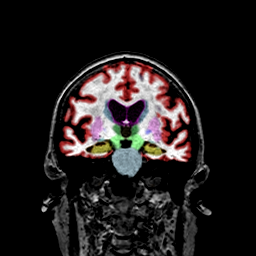
[im 134/238]
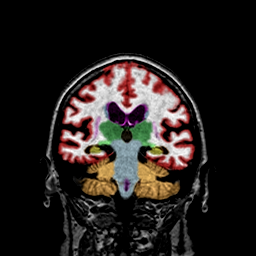
[im 149/238]
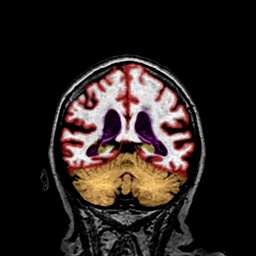
[im 163/238]
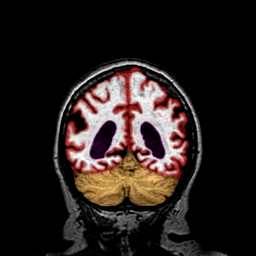
[im 178/238]
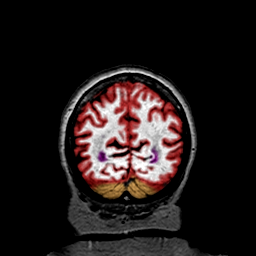
[im 193/238]
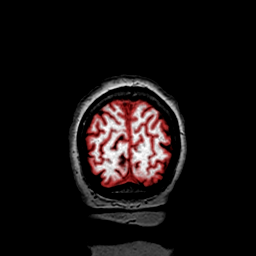
[im 208/238]
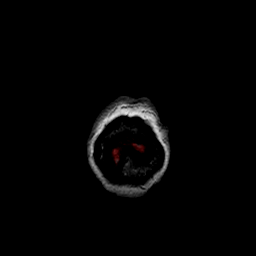
[im 223/238]
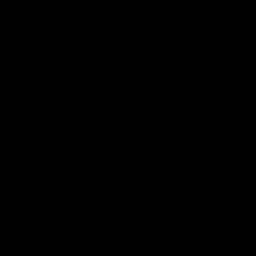
[im 238/238]
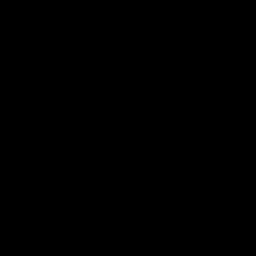

[Series 53: nqsegcb_sc_axl · 1.00mm/px · 17 of 239 slices shown]
[im 1/239]
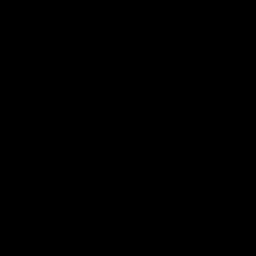
[im 15/239]
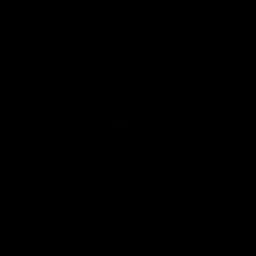
[im 30/239]
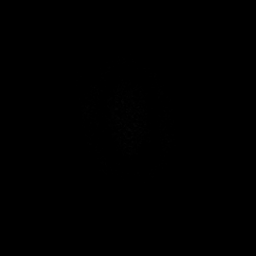
[im 45/239]
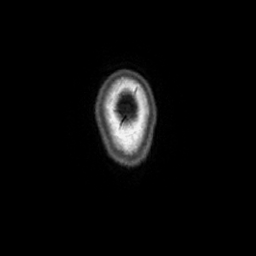
[im 60/239]
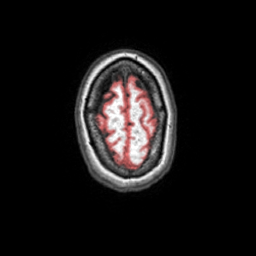
[im 75/239]
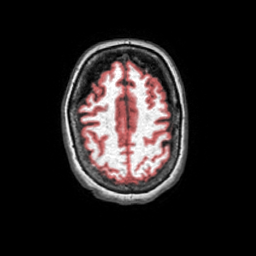
[im 90/239]
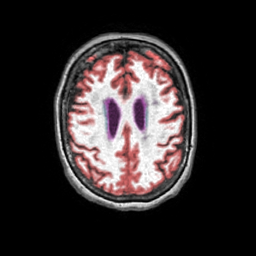
[im 105/239]
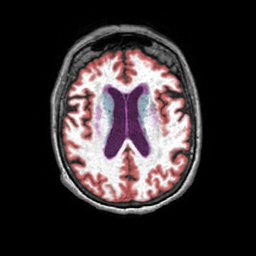
[im 120/239]
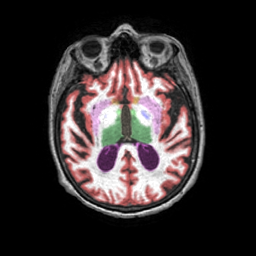
[im 134/239]
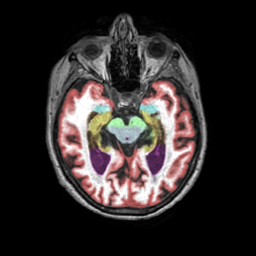
[im 149/239]
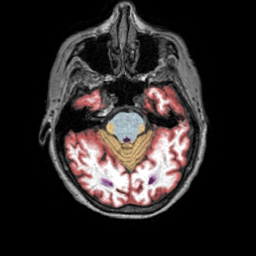
[im 164/239]
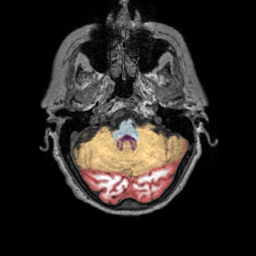
[im 179/239]
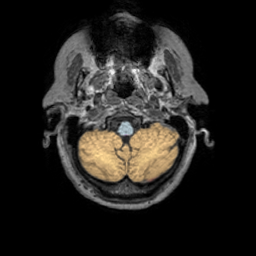
[im 194/239]
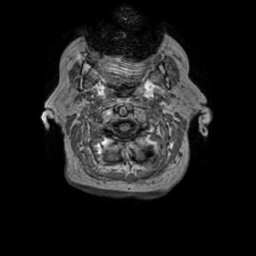
[im 209/239]
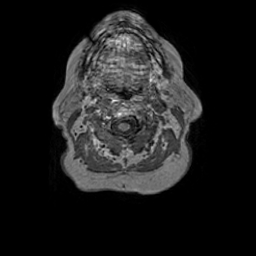
[im 224/239]
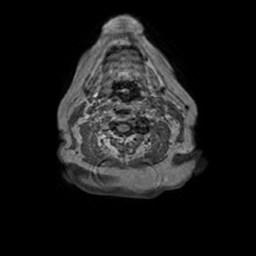
[im 239/239]
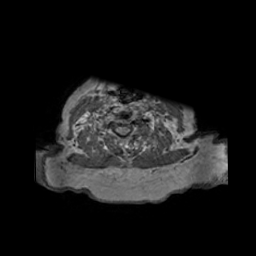

[Series 54: nqsegcb_sc_sag · 1.00mm/px · 14 of 203 slices shown]
[im 1/203]
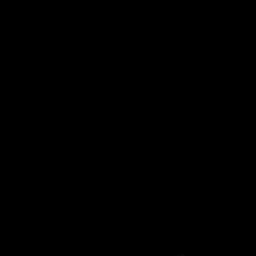
[im 16/203]
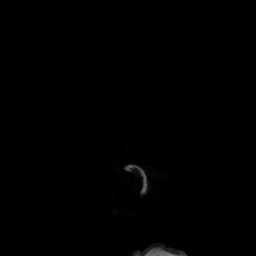
[im 32/203]
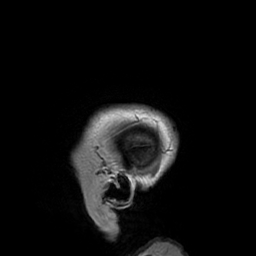
[im 47/203]
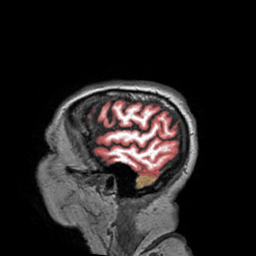
[im 63/203]
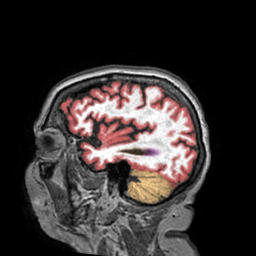
[im 78/203]
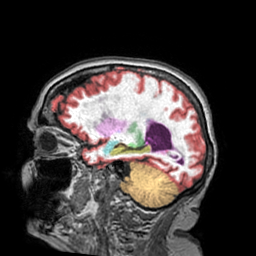
[im 94/203]
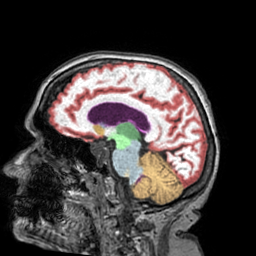
[im 109/203]
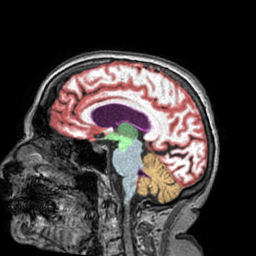
[im 125/203]
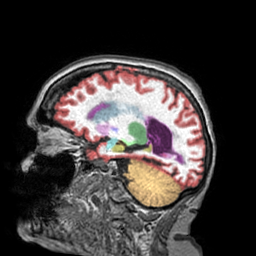
[im 140/203]
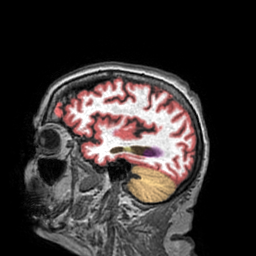
[im 156/203]
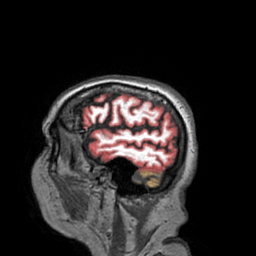
[im 171/203]
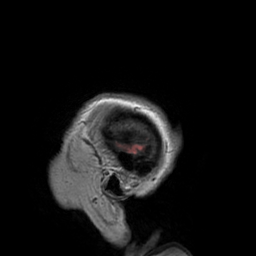
[im 187/203]
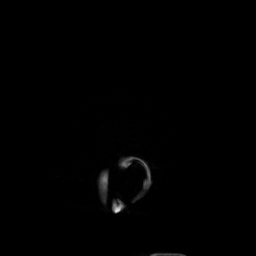
[im 203/203]
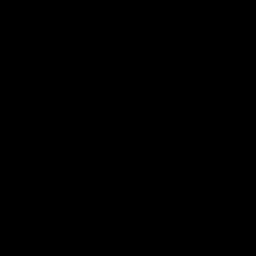

[48 of 48 positions shown; findings below may reference images not displayed]

FINDINGS: Brain: Mild generalized atrophy. Negative for hydrocephalus.
Moderate chronic microvascular ischemic changes in the white matter.
Brainstem intact.

Negative for acute infarct. Negative for hemorrhage or mass. No
midline shift.

Vascular: Normal arterial flow voids

Skull and upper cervical spine: Negative

Sinuses/Orbits: Negative

Other: None
IMPRESSION: Atrophy and chronic microvascular ischemia.  No acute abnormality.

## 2020-11-05 ENCOUNTER — Telehealth: Payer: Self-pay | Admitting: Internal Medicine

## 2020-11-05 NOTE — Telephone Encounter (Signed)
MAILBOX FULL. Pt due to schedule Medicare Annual Wellness Visit (AWV) either virtually or in office. Whichever the patients preference is.  Last AWV 11/22/18; please schedule at anytime with [facility] Health Advisor.  This should be a 40 minute visit.

## 2023-10-28 ENCOUNTER — Other Ambulatory Visit: Payer: Self-pay | Admitting: Obstetrics and Gynecology

## 2023-10-28 DIAGNOSIS — N63 Unspecified lump in unspecified breast: Secondary | ICD-10-CM

## 2023-11-03 ENCOUNTER — Ambulatory Visit
Admission: RE | Admit: 2023-11-03 | Discharge: 2023-11-03 | Disposition: A | Discharge status: Home / Self Care | Payer: Medicare Other | Source: Ambulatory Visit | Attending: Obstetrics and Gynecology | Admitting: Obstetrics and Gynecology

## 2023-11-03 DIAGNOSIS — N63 Unspecified lump in unspecified breast: Secondary | ICD-10-CM | POA: Insufficient documentation

## 2023-11-03 DIAGNOSIS — N6321 Unspecified lump in the left breast, upper outer quadrant: Secondary | ICD-10-CM | POA: Diagnosis present

## 2023-11-04 ENCOUNTER — Other Ambulatory Visit: Payer: Self-pay | Admitting: Obstetrics and Gynecology

## 2023-11-04 DIAGNOSIS — R928 Other abnormal and inconclusive findings on diagnostic imaging of breast: Secondary | ICD-10-CM

## 2023-11-04 DIAGNOSIS — N63 Unspecified lump in unspecified breast: Secondary | ICD-10-CM

## 2023-11-12 ENCOUNTER — Ambulatory Visit
Admission: RE | Admit: 2023-11-12 | Discharge: 2023-11-12 | Disposition: A | Discharge status: Home / Self Care | Payer: Medicare Other | Source: Ambulatory Visit | Attending: Obstetrics and Gynecology | Admitting: Obstetrics and Gynecology

## 2023-11-12 DIAGNOSIS — R928 Other abnormal and inconclusive findings on diagnostic imaging of breast: Secondary | ICD-10-CM | POA: Insufficient documentation

## 2023-11-12 DIAGNOSIS — N63 Unspecified lump in unspecified breast: Secondary | ICD-10-CM

## 2023-11-12 DIAGNOSIS — C50412 Malignant neoplasm of upper-outer quadrant of left female breast: Secondary | ICD-10-CM | POA: Insufficient documentation

## 2023-11-12 DIAGNOSIS — N6082 Other benign mammary dysplasias of left breast: Secondary | ICD-10-CM | POA: Insufficient documentation

## 2023-11-12 HISTORY — PX: BREAST BIOPSY: SHX20

## 2023-11-12 MED ORDER — LIDOCAINE-EPINEPHRINE 1 %-1:100000 IJ SOLN
5.0000 mL | Freq: Once | INTRAMUSCULAR | Status: AC
  Administered 2023-11-12: 5 mL
  Filled 2023-11-12: qty 5

## 2023-11-12 MED ORDER — LIDOCAINE 1 % OPTIME INJ - NO CHARGE
2.0000 mL | Freq: Once | INTRAMUSCULAR | Status: AC
  Administered 2023-11-12: 2 mL
  Filled 2023-11-12: qty 2

## 2023-11-12 MED ORDER — LIDOCAINE 1 % OPTIME INJ - NO CHARGE
2.0000 mL | Freq: Once | INTRAMUSCULAR | Status: AC
Start: 2023-11-12 — End: 2023-11-12
  Administered 2023-11-12: 2 mL
  Filled 2023-11-12: qty 2

## 2023-11-12 MED ORDER — LIDOCAINE-EPINEPHRINE 1 %-1:100000 IJ SOLN
8.0000 mL | Freq: Once | INTRAMUSCULAR | Status: AC
Start: 2023-11-12 — End: 2023-11-12
  Administered 2023-11-12: 8 mL
  Filled 2023-11-12: qty 8

## 2023-11-13 LAB — SURGICAL PATHOLOGY

## 2023-11-16 ENCOUNTER — Encounter: Payer: Self-pay | Admitting: *Deleted

## 2023-11-16 DIAGNOSIS — C50912 Malignant neoplasm of unspecified site of left female breast: Secondary | ICD-10-CM

## 2023-11-16 NOTE — Progress Notes (Signed)
Referral received from [facility] Radiology for newly diagnosed left breast cancer. Navigation initiated. Scheduled with Dr. Tonna Jones at [facility] general surgery for Monday 12/30 at 11:30am. Pt will see Dr. Alena Jones on 12/30 at 2pm. Appts reviewed with pt's son, Melody Jones, and confirmed.

## 2023-11-19 ENCOUNTER — Encounter: Payer: Self-pay | Admitting: *Deleted

## 2023-11-23 ENCOUNTER — Ambulatory Visit: Payer: Self-pay | Admitting: Surgery

## 2023-11-23 ENCOUNTER — Inpatient Hospital Stay: Payer: Medicare Other | Attending: Internal Medicine | Admitting: Internal Medicine

## 2023-11-23 ENCOUNTER — Other Ambulatory Visit: Payer: Self-pay | Admitting: Surgery

## 2023-11-23 ENCOUNTER — Encounter: Payer: Self-pay | Admitting: *Deleted

## 2023-11-23 ENCOUNTER — Other Ambulatory Visit: Payer: Medicare Other

## 2023-11-23 ENCOUNTER — Encounter: Payer: Self-pay | Admitting: Internal Medicine

## 2023-11-23 VITALS — BP 139/59 | HR 94 | Temp 99.4°F | Ht 62.0 in | Wt 134.0 lb

## 2023-11-23 DIAGNOSIS — Z170 Estrogen receptor positive status [ER+]: Secondary | ICD-10-CM

## 2023-11-23 DIAGNOSIS — F0390 Unspecified dementia without behavioral disturbance: Secondary | ICD-10-CM

## 2023-11-23 DIAGNOSIS — Z87891 Personal history of nicotine dependence: Secondary | ICD-10-CM | POA: Diagnosis not present

## 2023-11-23 DIAGNOSIS — I10 Essential (primary) hypertension: Secondary | ICD-10-CM | POA: Diagnosis not present

## 2023-11-23 DIAGNOSIS — F0150 Vascular dementia without behavioral disturbance: Secondary | ICD-10-CM | POA: Diagnosis not present

## 2023-11-23 DIAGNOSIS — C50812 Malignant neoplasm of overlapping sites of left female breast: Secondary | ICD-10-CM | POA: Diagnosis not present

## 2023-11-23 DIAGNOSIS — C50919 Malignant neoplasm of unspecified site of unspecified female breast: Secondary | ICD-10-CM | POA: Insufficient documentation

## 2023-11-23 DIAGNOSIS — C50412 Malignant neoplasm of upper-outer quadrant of left female breast: Secondary | ICD-10-CM | POA: Diagnosis present

## 2023-11-23 DIAGNOSIS — E119 Type 2 diabetes mellitus without complications: Secondary | ICD-10-CM | POA: Diagnosis not present

## 2023-11-23 DIAGNOSIS — M810 Age-related osteoporosis without current pathological fracture: Secondary | ICD-10-CM

## 2023-11-23 DIAGNOSIS — C50912 Malignant neoplasm of unspecified site of left female breast: Secondary | ICD-10-CM

## 2023-11-23 NOTE — H&P (Signed)
Subjective:   CC: Malignant neoplasm of upper-outer quadrant of left breast in female, estrogen receptor positive (CMS/HHS-HCC) [C50.412, Z17.0] HPI:  Melody Jones is a 82 y.o. female who was referred by Melody Cancel, MD for evaluation of above. Change was noted on last screening mammogram.    Past Medical History:  has no past medical history on file.  Past Surgical History:  has a past surgical history that includes Colonoscopy (06/09/2005); Colonoscopy (06/14/2015); and melinoma removal.  Family History: family history includes Cancer in her father; Dementia in her father.  Social History:  reports that she has quit smoking. She has never used smokeless tobacco. She reports current alcohol use. She reports that she does not use drugs.  Current Medications: has a current medication list which includes the following prescription(s): aspirin.  Allergies:  Allergies as of 11/23/2023 - Reviewed 11/23/2023  Allergen Reaction Noted   Latex Swelling 11/19/2016   Tegaderm ag mesh [silver] Itching 06/27/2015    ROS:  A 15 point review of systems was performed and was negative except as noted in HPI   Objective:     BP (!) 145/75   Pulse 82   Ht 157.5 cm (5' 2.01")   Wt 60.7 kg (133 lb 13.1 oz)   BMI 24.47 kg/m   Constitutional :  No distress, cooperative, alert  Lymphatics/Throat:  Supple with no lymphadenopathy  Respiratory:  Clear to auscultation bilaterally  Cardiovascular:  Regular rate and rhythm  Gastrointestinal: Soft, non-tender, non-distended, no organomegaly.  Musculoskeletal: Steady gait and movement  Skin: Cool and moist  Psychiatric: Normal affect, non-agitated, not confused  Breast: Normal appearance and no palpable abnormality in right breast and axilla.  Left breast with palpable lump, deep as expected. Chaperone present for exam.      LABS:   SURGICAL PATHOLOGY SURGICAL PATHOLOGY [facility] [address], [address] [place], [place] [address] Telephone [phone] or [phone] Fax [phone]  REPORT OF SURGICAL PATHOLOGY   Accession #: [id number] Patient Name: Melody, Jones Visit # : [id number]  MRN: [id number] Physician: Meda Klinefelter DOB/Age 14-May-1941 (Age: 60) Gender: F Collected Date: 11/12/2023 Received Date: 11/12/2023  FINAL DIAGNOSIS       1. Breast, left, needle core biopsy, 2:30 o'clock, 6cmfn, ribbon clip :      - INVASIVE MAMMARY CARCINOMA, NO SPECIAL TYPE.      - TUBULE FORMATION: SCORE 3      - NUCLEAR PLEOMORPHISM: SCORE 2      - MITOTIC COUNT: SCORE 1      - TOTAL SCORE: 6      - OVERALL GRADE: 2      - LYMPHOVASCULAR INVASION: NOT IDENTIFIED      - CANCER LENGTH: 11 MM      - CALCIFICATIONS: NOT IDENTIFIED      - DUCTAL CARCINOMA IN SITU: NOT IDENTIFIED      - SEE NOTE.       2. Breast, left, needle core biopsy, 2 o'clock, 6cmfn, coil :      - BENIGN BREAST TISSUE WITH INVOLUTIONAL CHANGE AND APOCRINE METAPLASIA.      - NEGATIVE FOR ATYPIA AND MALIGNANCY.       Diagnosis Note : These results were communicated to Randa Lynn, RN in the      [place] [facility] on 11/13/2023.      ER, PR, and HER2 will be performed on block IA and reported in an addendum.      This  case underwent intradepartmental consultation and Dr. Luisa Hart concurs with      the interpretation.      ELECTRONIC SIGNATURE : Rubinas Md, Delice Bison , Sports administrator, Electronic Signature  MICROSCOPIC DESCRIPTION  CASE COMMENTS STAINS USED IN DIAGNOSIS: H&E-2 H&E-3 H&E-4 H&E Her2 by IHC ER-ACIS PR-ACIS *RECUT 1 SLIDE H&E-2 H&E-3 H&E-4 H&E Stains used in diagnosis 1 Her2 by IHC, 1 ER-ACIS, 1 PR-ACIS IHC scores are reported using ASCO/CAP scoring criteria.  An IHC Score of 0 or 1+  is NEGATIVE for HER2, 3+ is POSITIVE for HER2, and 2+ is EQUIVOCAL. Equivocal results are reflexed to either FISH or IHC testing. Specimens are fixed in 10% Neutral Buffered Formalin for at least  6 hours and up to 72 hours. These tests have not be validated on decalcified tissue.  Results should be interpreted with caution given the possibility of false negative results on decalcified specimens. Antibody Clone for HER2 is 4B5 (PATHWAY). Some of these immunohistochemical stains may have been developed and the performance characteristics determined by [facility].  Some may not have been cleared or approved by the U.S. Food and Drug Administration.  The FDA has determined that such clearance or approval is not necessary.  This test is used for clinical purposes.  It should not be regarded as investigational or for research.  This laboratory is certified under the Clinical Laboratory Improvement Amendments of 1988 (CLIA-88) as qualified to perform high complexity clinical laboratory testing. Estrogen receptor (6F11), immunohistochemical stains are performed on formalin fixed, paraffin embedded tissue using a 3,3"-diaminobenzidine (DAB) chromogen and Leica Bond Autostainer System.  The staining intensity of the nucleus is scored manually and is reported as the percentage of tumor cell nuclei demonstrating specific nuclear staining.Specimens are fixed in 10% Neutral Buffered Formalin for at least 6 hours and up to 72 hours.  These tests have not be validated on decalcified tissue.  Results should be interpreted with caution given the possibility of false negative results on decalcified specimens. PR progesterone receptor (16), immunohistochemical stains are performed on formalin fixed, paraffin embedded tissue using a 3,3"-diaminobenzidine (DAB) chromogen and Leica Bond Autostainer System.  The staining intensity of the nucleus is scored manually and is reported as the percentage of tumor cell nuclei demonstrating specific nuclear staining.Specimens are fixed in 10% Neutral Buffered Formalin for at least 6 hours and up to 72 hours. These tests have not be validated on  decalcified tissue.  Results should be interpreted with caution given the possibility of false negative results on decalcified specimens.  ADDENDUM Breast, left, needle core biopsy, 2:30 o'clock, 6cmfn, ribbon clip PROGNOSTIC INDICATORS  Results: IMMUNOHISTOCHEMICAL AND MORPHOMETRIC ANALYSIS PERFORMED MANUALLY The tumor cells are NEGATIVE for Her2 (1+). Estrogen Receptor:  70%, POSITIVE, MODERATE-STRONG STAINING INTENSITY Progesterone Receptor:  90%, POSITIVE, STRONG STAINING INTENSITY REFERENCE RANGE ESTROGEN RECEPTOR NEGATIVE     0% POSITIVE       =>1% REFERENCE RANGE PROGESTERONE RECEPTOR NEGATIVE     0% POSITIVE        =>1% All controls stained appropriately [place] Md, Fred , Sports administrator, International aid/development worker ( Signed 12 23 2024)   CLINICAL HISTORY  SPECIMEN(S) OBTAINED 1. Breast, left, needle core biopsy, 2:30 O'clock, 6cmfn, Ribbon Clip 2. Breast, left, needle core biopsy, 2 O'clock, 6cmfn, Coil  SPECIMEN COMMENTS: 1. TIF: 8:54 AM, CIT less than 30 sec; palpable suspicious mass 2. TIF: 9:07 AM, CIT less than 30 sec; incidental oval hypoechoic area SPECIMEN CLINICAL INFORMATION: 1. Malignancy 2. Benign breast tissue, FA, PASH, LN,  malignancy    Gross Description 1. Received in formalin, time in formalin 8:54 a.m.and CIT less than 30 seconds, are four cores of soft tan yellow tissue measuring 0.7 and 1.2 cm in length and each 0.2 cm in diameter.The specimen is submitted in toto. 2. Received in formalin are fragments and cores of soft tan yellow tissue measuring 0.7 x 0.6 x 0.2 cm in aggregate.The specimen is submitted in toto.(GRP:kh 11/12/23)        Report signed out from the following location(s) [place] [address], [place] [address] CLIA #: [id number]  [facility] [address] [place], [place] [address] CLIA #: [id number]   Addendum by Glennon Mac, MD on 11/13/2023  5:07 PM EST   ADDENDUM REPORT: 11/13/2023 15:07   ADDENDUM:  PATHOLOGY revealed: Site 1. Breast, left, needle core biopsy, 2:30  o'clock, 6 cmfn, ribbon clip :- INVASIVE MAMMARY CARCINOMA, NO  SPECIAL TYPE.- OVERALL GRADE: 2. - LYMPHOVASCULAR INVASION: NOT  IDENTIFIED - CANCER LENGTH: 11 MM. - CALCIFICATIONS: NOT IDENTIFIED.  - DUCTAL CARCINOMA IN SITU: NOT IDENTIFIED.   Pathology results are CONCORDANT with imaging findings, per Dr.  Meda Klinefelter.   PATHOLOGY revealed: Site 2. Breast, left, needle core biopsy, 2  o'clock, 6 cmfn, coil : - BENIGN BREAST TISSUE WITH INVOLUTIONAL  CHANGE AND APOCRINE METAPLASIA. - NEGATIVE FOR ATYPIA AND  MALIGNANCY.   Pathology results are CONCORDANT with imaging findings, per Dr.  Meda Klinefelter.   Pathology results and recommendations below were discussed with  patient's son (POA) Alinda Money by telephone on 11/13/2023. Alinda Money reported  biopsy site within normal limits. Post biopsy care instructions were  reviewed, questions were answered and my direct phone number was  provided to patient. Patient was instructed to call [facility] if any concerns or questions arise related to the biopsy.   RECOMMENDATIONS: 1. Surgical and oncological consultation for Site 1  only. Request for surgical and oncological consultation relayed to  Irving Shows RN at [facility] by Randa Lynn RN  11/13/2023.   Pathology results reported by Randa Lynn RN on 11/13/2023.    Electronically Signed    By: Meda Klinefelter M.D.    On: 11/13/2023 15:07    RADS: CLINICAL DATA:  82 year old female presenting with a palpable area  of concern in the left breast, felt by the ordering provider on  clinical breast exam. She is also due for annual screening.   EXAM:  DIGITAL DIAGNOSTIC BILATERAL MAMMOGRAM WITH TOMOSYNTHESIS AND CAD;  ULTRASOUND LEFT BREAST LIMITED   TECHNIQUE:  Bilateral digital diagnostic mammography and breast tomosynthesis  was  performed. The images were evaluated with computer-aided  detection. ; Targeted ultrasound examination of the left breast was  performed.   COMPARISON: Previous exam(s).   ACR Breast Density Category b: There are scattered areas of  fibroglandular density.   FINDINGS:  MAMMOGRAM:   Right:   No new suspicious mass, calcification, or other findings are  identified in the right breast.   Left:   The site of palpable concern, there is 1.8 cm spiculated mass in the  upper-outer quadrant posterior depth (spot tangential image 32/49,  MLO 2 of 2 image 32/47). Approximately 1.5 cm anterior/superior to  the palpable mass, there is an additional 0.6 cm focal asymmetry  (spot tangential image 32/49) that is new compared to prior  mammograms.   Incidentally visualized vascular calcifications in the inner central  left breast middle  depth. No additional suspicious findings are  identified in the left breast.   ULTRASOUND:   Targeted left breast ultrasound was performed.   At 2:30 o'clock 6 cm from the nipple, there is spiculated hypoechoic  mass moderate internal vascularity. It measures 1.6 x 1.5 x 1.5 cm.  This corresponds with the palpable area of concern and the  spiculated mass seen on mammogram.   At 2 o'clock 6 cm from the nipple, there is an oval circumscribed  hypoechoic mass, without internal vascularity. It measures 0.9 x 0.3  x 0.7 cm. This corresponds with the developing asymmetry seen on  mammogram.   Targeted left axillary ultrasound demonstrates morphologically  benign lymph nodes. No lymphadenopathy.   IMPRESSION:  1. LEFT breast palpable 1.6 cm spiculated mass in the 2:30 o'clock  position is highly suspicious for malignancy.  2. LEFT breast 0.9 cm mass in the 2 o'clock position is concerning  for satellite lesion.  3. No LEFT axillary lymphadenopathy.  4. No evidence of malignancy in the RIGHT breast.   RECOMMENDATION:  Left breast ultrasound-guided  biopsy of the masses in the 2:30  o'clock and 2 o'clock positions (2 site).   I have discussed the findings and recommendations with the patient  the patient's daughter-in-law. The biopsy procedure was discussed  with the patient/family and questions were answered. Patient/family  expressed their understanding of the biopsy recommendation. Patient  will be scheduled for biopsy at her earliest convenience by the  schedulers. Ordering provider will be notified. If applicable, a  reminder letter will be sent to the patient regarding the next  appointment.   BI-RADS CATEGORY  5: Highly suggestive of malignancy.    Electronically Signed    By: Jacob Moores M.D.    On: 11/03/2023 13:58   Assessment:   Malignant neoplasm of upper-outer quadrant of left breast in female, estrogen receptor positive (CMS/HHS-HCC) [C50.412, Z17.0]  Plan:     1. Malignant neoplasm of upper-outer quadrant of left breast in female, estrogen receptor positive (CMS/HHS-HCC) [C50.412, Z17.0]  Discussed the risk of surgery including recurrence, chronic pain, post-op infxn, poor/delayed wound healing, poor cosmesis, seroma, hematoma formation, and possible re-operation to address said risks. The risks of general anesthetic, if used, includes MI, CVA, sudden death or even reaction to anesthetic medications also discussed.  Typical post-op recovery time and possbility of activity restrictions were also discussed.  Alternatives include continued observation.  Benefits include possible symptom relief, pathologic evaluation, and/or curative excision.   The patient verbalized understanding and all questions were answered to the patient's satisfaction.  2. Patient has elected to proceed with surgical treatment. Procedure will be scheduled.  Will proceed with left lumpectomy, SCOUT tag.  Oncology agrees [facility] will not change plans for her since family not interested in chemo, so will NOT proceed with  [facility]  labs/images/medications/previous chart entries reviewed personally and relevant changes/updates noted above.

## 2023-11-23 NOTE — Progress Notes (Signed)
Accompanied patient and family to initial medical oncology appointment.  Care plan summary given to patient.  Lumpectomy is scheduled for 1/9, they will see Dr. Alena Bills back on 2/3 and further discuss adjuvant treatment.   They are unsure at this time is they will pursue radiation and AI.

## 2023-11-23 NOTE — Progress Notes (Signed)
Cancer Center CONSULT NOTE  Patient Care Team: Schermerhorn, Ihor Austin, MD as PCP - General (Obstetrics and Gynecology) Schermerhorn, Ihor Austin, MD as Consulting Physician (Obstetrics and Gynecology) Scot Jun, MD (Inactive) (Gastroenterology) Deirdre Evener, MD (Dermatology) [facility] Neurology (Neurology) Michaelyn Barter, MD as Consulting Physician (Oncology)  REFERRING PROVIDER: Dr. Jennell Corner  REASON FOR REFFERAL: Left breast cancer  CANCER STAGING   Cancer Staging  Breast cancer [facility]) Staging form: Breast, AJCC 8th Edition - Clinical stage from 11/12/2023: Stage IA (cT1c, cN0, cM0, G2, ER+, PR+, HER2-) - Signed by Michaelyn Barter, MD on 11/23/2023 Stage prefix: Initial diagnosis Histologic grading system: 3 grade system   ASSESSMENT & PLAN:  Melody Jones 82 y.o. female with pmh of vascular dementia, hypertension presented to medical oncology for management of left breast IDC.  # Left breast IDC stage Ia, cT1c, N0 M0, ER/PR positive HER2 negative -Felt palpable lump in the left breast during shower.  Mammogram and ultrasound reviewed.  1.6 cm mass at 230 6:00 from nipple.  Another 0.9 cm mass to o'clock 6 cm from nipple.  Both areas were biopsied.  Biopsy from 230 o'clock mass showed invasive mammary carcinoma, overall grade 2, ER 70%, PR 90% and HER2 negative by IHC 1+.  Biopsy from 2 o'clock position was benign.  Patient has advanced dementia.  Her sons are power of attorney.  She was seen by Dr. Tonna Boehringer and is scheduled for lumpectomy on January 9.  May not necessarily benefit from sentinel lymph node sampling as it will not change the management.  We discussed about role of radiation, Oncotype DX testing to assess the need for adjuvant chemotherapy followed by endocrine therapy.  At this time, the son would like to focus on minimal treatment options to preserve her quality of life.  They would like to hold off on referral to radiation  oncology.  They are not interested in chemotherapy options which I think is reasonable considering her advanced dementia.  So we will hold off on Oncotype DX testing.  I also briefly discussed about endocrine therapy for 5 years.  Side effects such as hot flashes, myalgias, arthralgias, leg swelling, osteoporosis and less likely nausea was discussed.  Son does not think that she would remember to take a pill on regular basis.  I will follow-up with them again in 3 weeks and reassess about their interest in endocrine therapy.   No orders of the defined types were placed in this encounter.  RTC in 3 weeks postsurgery.  No labs today  The total time spent in the appointment was 60 minutes encounter with patients including review of chart and various tests results, discussions about plan of care and coordination of care plan   All questions were answered. The patient knows to call the clinic with any problems, questions or concerns. No barriers to learning was detected.  Michaelyn Barter, MD 12/30/20243:11 PM   HISTORY OF PRESENTING ILLNESS:  Melody Jones 82 y.o. female with pmh of vascular dementia, hypertension presented to medical oncology for management of left breast IDC.  Patient has vascular dementia.  Lives with her husband.  Both her sons have power of attorney.  She needs assistance with activities of daily living.  She is able to follow simple conversation but may not be able to recall after 20 minutes.   I have reviewed her chart and materials related to her cancer extensively and collaborated history with the patient. Summary of oncologic history is  as follows: Oncology History  Breast cancer (HCC)  11/03/2023 Mammogram   Patient complained of discomfort in left breast during shower.  She has vascular dementia and needs help with her ADLs.  Diagnostic mammogram from 11/03/2023- MAMMOGRAM:   Right:   No new suspicious mass, calcification, or other findings are identified in  the right breast.   Left:   The site of palpable concern, there is 1.8 cm spiculated mass in the upper-outer quadrant posterior depth (spot tangential image 32/49, MLO 2 of 2 image 32/47). Approximately 1.5 cm anterior/superior to the palpable mass, there is an additional 0.6 cm focal asymmetry (spot tangential image 32/49) that is new compared to prior mammograms.   Incidentally visualized vascular calcifications in the inner central left breast middle depth. No additional suspicious findings are identified in the left breast.   ULTRASOUND:   Targeted left breast ultrasound was performed.   At 2:30 o'clock 6 cm from the nipple, there is spiculated hypoechoic mass moderate internal vascularity. It measures 1.6 x 1.5 x 1.5 cm. This corresponds with the palpable area of concern and the spiculated mass seen on mammogram.   At 2 o'clock 6 cm from the nipple, there is an oval circumscribed hypoechoic mass, without internal vascularity. It measures 0.9 x 0.3 x 0.7 cm. This corresponds with the developing asymmetry seen on mammogram.   Targeted left axillary ultrasound demonstrates morphologically benign lymph nodes. No lymphadenopathy.   IMPRESSION: 1. LEFT breast palpable 1.6 cm spiculated mass in the 2:30 o'clock position is highly suspicious for malignancy. 2. LEFT breast 0.9 cm mass in the 2 o'clock position is concerning for satellite lesion. 3. No LEFT axillary lymphadenopathy. 4. No evidence of malignancy in the RIGHT breast.     11/12/2023 Pathology Results   FINAL DIAGNOSIS       1. Breast, left, needle core biopsy, 2:30 o'clock, 6cmfn, ribbon clip :      - INVASIVE MAMMARY CARCINOMA, NO SPECIAL TYPE.      - TUBULE FORMATION: SCORE 3      - NUCLEAR PLEOMORPHISM: SCORE 2      - MITOTIC COUNT: SCORE 1      - TOTAL SCORE: 6      - OVERALL GRADE: 2      - LYMPHOVASCULAR INVASION: NOT IDENTIFIED      - CANCER LENGTH: 11 MM      - CALCIFICATIONS: NOT IDENTIFIED       - DUCTAL CARCINOMA IN SITU: NOT IDENTIFIED      - SEE NOTE.       2. Breast, left, needle core biopsy, 2 o'clock, 6cmfn, coil :      - BENIGN BREAST TISSUE WITH INVOLUTIONAL CHANGE AND APOCRINE METAPLASIA.      - NEGATIVE FOR ATYPIA AND MALIGNANCY.   ADDENDUM  Breast, left, needle core biopsy, 2:30 o'clock, 6cmfn, ribbon clip  PROGNOSTIC INDICATORS   Results:  IMMUNOHISTOCHEMICAL AND MORPHOMETRIC ANALYSIS PERFORMED MANUALLY  The tumor cells are NEGATIVE for Her2 (1+).  Estrogen Receptor:  70%, POSITIVE, MODERATE-STRONG STAINING INTENSITY  Progesterone Receptor:  90%, POSITIVE, STRONG STAINING INTENSITY    11/12/2023 Cancer Staging   Staging form: Breast, AJCC 8th Edition - Clinical stage from 11/12/2023: Stage IA (cT1c, cN0, cM0, G2, ER+, PR+, HER2-) - Signed by Michaelyn Barter, MD on 11/23/2023 Stage prefix: Initial diagnosis Histologic grading system: 3 grade system   11/23/2023 Initial Diagnosis   Breast cancer [facility])     MEDICAL HISTORY:  Past Medical History:  Diagnosis Date  Anxiety    Cancer (HCC)    Melanoma LT Shoulder   Depression    Diabetes mellitus without complication (HCC)    Hyperlipidemia    Hypertension    Osteoporosis     SURGICAL HISTORY: Past Surgical History:  Procedure Laterality Date   BREAST BIOPSY Left 11/12/2023   ribbon clip / path pending   BREAST BIOPSY Left 11/12/2023   coil clip/ path pending   BREAST BIOPSY Left 11/12/2023   [place] LT BREAST BX W LOC DEV 1ST LESION IMG BX SPEC US GUIDE 11/12/2023 [facility]   BREAST BIOPSY Left 11/12/2023   [place] LT BREAST BX W LOC DEV EA ADD LESION IMG BX SPEC US GUIDE 11/12/2023 [facility]   BUNIONECTOMY  11/25/2011   COLONOSCOPY N/A 06/14/2015   Procedure: COLONOSCOPY;  Surgeon: Scot Jun, MD;  Location: [facility] ENDOSCOPY;  Service: Endoscopy;  Laterality: N/A;   MELANOMA EXCISION Left Shoulder   TUBAL LIGATION      SOCIAL HISTORY: Social History   Socioeconomic History    Marital status: Married    Spouse name: Not on file   Number of children: 3   Years of education: Not on file   Highest education level: Some college, no degree  Occupational History   Occupation: Retired  Tobacco Use   Smoking status: Former    Current packs/day: 0.00    Average packs/day: 0.3 packs/day for 20.0 years (5.0 ttl pk-yrs)    Types: Cigarettes    Start date: 1968    Quit date: 1988    Years since quitting: 37.0   Smokeless tobacco: Never   Tobacco comments:    Smoking cessation materials not required  Vaping Use   Vaping status: Never Used  Substance and Sexual Activity   Alcohol use: Yes    Alcohol/week: 0.0 standard drinks of alcohol    Comment: daily "night cap"   Drug use: No   Sexual activity: Not Currently  Other Topics Concern   Not on file  Social History Narrative   Not on file   Social Drivers of Health   Financial Resource Strain: Low Risk  (11/23/2023)   Received from [facility]   Overall Financial Resource Strain (CARDIA)    Difficulty of Paying Living Expenses: Not very hard  Food Insecurity: No Food Insecurity (11/23/2023)   Hunger Vital Sign    Worried About Running Out of Food in the Last Year: Never true    Ran Out of Food in the Last Year: Never true  Transportation Needs: No Transportation Needs (11/23/2023)   PRAPARE - Administrator, Civil Service (Medical): No    Lack of Transportation (Non-Medical): No  Physical Activity: Inactive (11/19/2017)   Exercise Vital Sign    Days of Exercise per Week: 0 days    Minutes of Exercise per Session: 0 min  Stress: Stress Concern Present (11/22/2018)   [employer] of Occupational Health - Occupational Stress Questionnaire    Feeling of Stress : To some extent  Social Connections: Moderately Integrated (11/19/2017)   Social Connection and Isolation Panel [NHANES]    Frequency of Communication with Friends and Family: Once a week    Frequency of Social  Gatherings with Friends and Family: Twice a week    Attends Religious Services: More than 4 times per year    Active Member of [employer] or Organizations: No    Attends Banker Meetings: Never    Marital Status: Married  Catering manager Violence:  Not At Risk (11/23/2023)   Humiliation, Afraid, Rape, and Kick questionnaire    Fear of Current or Ex-Partner: No    Emotionally Abused: No    Physically Abused: No    Sexually Abused: No    FAMILY HISTORY: Family History  Problem Relation Age of Onset   Alcohol abuse Mother    Stroke Father    Cancer Father    Breast cancer Neg Hx     ALLERGIES:  is allergic to latex.  MEDICATIONS:  Current Outpatient Medications  Medication Sig Dispense Refill   aspirin EC 81 MG tablet Take 1 tablet (81 mg total) by mouth daily. 90 tablet 3   atorvastatin (LIPITOR) 20 MG tablet Take 1 tablet (20 mg total) by mouth at bedtime. (Patient not taking: Reported on 11/23/2023) 90 tablet 1   Cholecalciferol (VITAMIN D-1000 MAX ST) 1000 UNITS tablet Take 1 tablet by mouth daily. (Patient not taking: Reported on 11/23/2023)     sertraline (ZOLOFT) 50 MG tablet Take 1 tablet (50 mg total) by mouth at bedtime. (Patient not taking: Reported on 11/23/2023) 90 tablet 1   No current facility-administered medications for this visit.    REVIEW OF SYSTEMS:   Pertinent information mentioned in HPI All other systems were reviewed with the patient and are negative.  PHYSICAL EXAMINATION: ECOG PERFORMANCE STATUS: 1 - Symptomatic but completely ambulatory  Vitals:   11/23/23 1359  BP: (!) 139/59  Pulse: 94  Temp: 99.4 F (37.4 C)  SpO2: 97%   Filed Weights   11/23/23 1359  Weight: 134 lb (60.8 kg)    GENERAL:alert, no distress and comfortable SKIN: skin color, texture, turgor are normal, no rashes or significant lesions EYES: normal, conjunctiva are pink and non-injected, sclera clear OROPHARYNX:no exudate, no erythema and lips, buccal mucosa,  and tongue normal  NECK: supple, thyroid normal size, non-tender, without nodularity LYMPH:  no palpable lymphadenopathy in the cervical, axillary or inguinal LUNGS: clear to auscultation and percussion with normal breathing effort HEART: regular rate & rhythm and no murmurs and no lower extremity edema ABDOMEN:abdomen soft, non-tender and normal bowel sounds Musculoskeletal:no cyanosis of digits and no clubbing  PSYCH: alert & oriented x 3 with fluent speech NEURO: no focal motor/sensory deficits  LABORATORY DATA:  I have reviewed the data as listed Lab Results  Component Value Date   WBC 6.7 08/02/2019   HGB 15.4 08/02/2019   HCT 44.9 08/02/2019   MCV 89 08/02/2019   PLT 288 08/02/2019   No results for input(s): "NA", "K", "CL", "CO2", "GLUCOSE", "BUN", "CREATININE", "CALCIUM", "GFRNONAA", "GFRAA", "PROT", "ALBUMIN", "AST", "ALT", "ALKPHOS", "BILITOT", "BILIDIR", "IBILI" in the last 8760 hours.  RADIOGRAPHIC STUDIES: I have personally reviewed the radiological images as listed and agreed with the findings in the report. [place] LT BREAST BX W LOC DEV 1ST LESION IMG BX SPEC US GUIDE Addendum Date: 11/13/2023 ADDENDUM REPORT: 11/13/2023 15:07 ADDENDUM: PATHOLOGY revealed: Site 1. Breast, left, needle core biopsy, 2:30 o'clock, 6 cmfn, ribbon clip :- INVASIVE MAMMARY CARCINOMA, NO SPECIAL TYPE.- OVERALL GRADE: 2. - LYMPHOVASCULAR INVASION: NOT IDENTIFIED - CANCER LENGTH: 11 MM. - CALCIFICATIONS: NOT IDENTIFIED. - DUCTAL CARCINOMA IN SITU: NOT IDENTIFIED. Pathology results are CONCORDANT with imaging findings, per Dr. Meda Klinefelter. PATHOLOGY revealed: Site 2. Breast, left, needle core biopsy, 2 o'clock, 6 cmfn, coil : - BENIGN BREAST TISSUE WITH INVOLUTIONAL CHANGE AND APOCRINE METAPLASIA. - NEGATIVE FOR ATYPIA AND MALIGNANCY. Pathology results are CONCORDANT with imaging findings, per Dr. Meda Klinefelter. Pathology results and recommendations  below were discussed with patient's son (POA)  Alinda Money by telephone on 11/13/2023. Alinda Money reported biopsy site within normal limits. Post biopsy care instructions were reviewed, questions were answered and my direct phone number was provided to patient. Patient was instructed to call [facility] if any concerns or questions arise related to the biopsy. RECOMMENDATIONS: 1. Surgical and oncological consultation for Site 1 only. Request for surgical and oncological consultation relayed to Irving Shows RN at [facility] by Randa Lynn RN 11/13/2023. Pathology results reported by Randa Lynn RN on 11/13/2023. Electronically Signed   By: Meda Klinefelter M.D.   On: 11/13/2023 15:07   Result Date: 11/13/2023 CLINICAL DATA:  Palpable suspicious LEFT breast mass. Additional indeterminate asymmetry EXAM: ULTRASOUND GUIDED LEFT BREAST CORE NEEDLE BIOPSY x2 COMPARISON:  Previous exam(s). PROCEDURE: I met with the patient and we discussed the procedure of ultrasound-guided biopsy, including benefits and alternatives. Informed written consent was obtained from patient's son and power of attorney Alinda Money. We discussed the high likelihood of a successful procedure. We discussed the risks of the procedure, including infection, bleeding, tissue injury, clip migration, and inadequate sampling. Informed written consent was given. The usual time-out protocol was performed immediately prior to the procedure. Site 1: LEFT breast 2:30 6 cm from the nipple Lesion quadrant: Upper outer quadrant Using sterile technique and 1% lidocaine and 1% lidocaine with epinephrine as local anesthetic, under direct ultrasound visualization, a 14 gauge spring-loaded device was used to perform biopsy of the mass at 2:30 6 cm from the nipple using a lateral approach. At the conclusion of the procedure a RIBBON shaped tissue marker clip was deployed into the biopsy cavity. Follow up 2 view mammogram was performed and dictated separately. Site 2: LEFT breast 2 o'clock 6 cm from the  nipple Lesion quadrant: Upper outer quadrant During setup, an additional morphologically benign appearing intramammary lymph node was noted at 2 o'clock 9 cm from the nipple. This is most likely to correspond to the asymmetry of mammographic concern. However given indeterminate sonographic appearance, biopsy was performed of the LEFT breast at 2 o'clock 6 cm from the nipple as per prior recommendation. During administration of lidocaine, this area became less conspicuous and may reflect a prominent fat lobule. Using sterile technique and 1% lidocaine and 1% lidocaine with epinephrine as local anesthetic, under direct ultrasound visualization, a 14 gauge spring-loaded device was used to perform biopsy of the mass at 2 o'clock 6 cm from the nipple using a lateral approach. At the conclusion of the procedure a COIL shaped tissue marker clip was deployed into the biopsy cavity. Follow up 2 view mammogram was performed and dictated separately. IMPRESSION: 1. Ultrasound guided biopsy of a suspicious LEFT breast mass. No apparent complications. 2. Ultrasound-guided biopsy of a sonographically identified LEFT breast area at 2 o'clock 6 cm from the nipple. No apparent complications. This area does not correspond to the asymmetry of mammographic concern. The likely correlate for the asymmetry of mammographic concern is a morphologically benign appearing intramammary lymph node. Given patient comorbidities and advanced dementia, recommend management of this asymmetry be determined by goals of care and patient's treatment team. If it will change clinical or surgical management, repeat LEFT breast ultrasound with potential ultrasound-guided biopsy of the visualized intramammary lymph node could be considered. If surgical management is deferred and only medical management is pursued, this could be followed mammographically and sonographically on subsequent follow-up studies. Electronically Signed: By: Meda Klinefelter M.D. On:  11/12/2023 11:49  [place] LT BREAST BX W LOC DEV EA ADD LESION IMG BX SPEC US GUIDE Addendum Date: 11/13/2023 ADDENDUM REPORT: 11/13/2023 15:07 ADDENDUM: PATHOLOGY revealed: Site 1. Breast, left, needle core biopsy, 2:30 o'clock, 6 cmfn, ribbon clip :- INVASIVE MAMMARY CARCINOMA, NO SPECIAL TYPE.- OVERALL GRADE: 2. - LYMPHOVASCULAR INVASION: NOT IDENTIFIED - CANCER LENGTH: 11 MM. - CALCIFICATIONS: NOT IDENTIFIED. - DUCTAL CARCINOMA IN SITU: NOT IDENTIFIED. Pathology results are CONCORDANT with imaging findings, per Dr. Meda Klinefelter. PATHOLOGY revealed: Site 2. Breast, left, needle core biopsy, 2 o'clock, 6 cmfn, coil : - BENIGN BREAST TISSUE WITH INVOLUTIONAL CHANGE AND APOCRINE METAPLASIA. - NEGATIVE FOR ATYPIA AND MALIGNANCY. Pathology results are CONCORDANT with imaging findings, per Dr. Meda Klinefelter. Pathology results and recommendations below were discussed with patient's son (POA) Alinda Money by telephone on 11/13/2023. Alinda Money reported biopsy site within normal limits. Post biopsy care instructions were reviewed, questions were answered and my direct phone number was provided to patient. Patient was instructed to call [facility] if any concerns or questions arise related to the biopsy. RECOMMENDATIONS: 1. Surgical and oncological consultation for Site 1 only. Request for surgical and oncological consultation relayed to Irving Shows RN at [facility] by Randa Lynn RN 11/13/2023. Pathology results reported by Randa Lynn RN on 11/13/2023. Electronically Signed   By: Meda Klinefelter M.D.   On: 11/13/2023 15:07   Result Date: 11/13/2023 CLINICAL DATA:  Palpable suspicious LEFT breast mass. Additional indeterminate asymmetry EXAM: ULTRASOUND GUIDED LEFT BREAST CORE NEEDLE BIOPSY x2 COMPARISON:  Previous exam(s). PROCEDURE: I met with the patient and we discussed the procedure of ultrasound-guided biopsy, including benefits and alternatives. Informed written consent was obtained  from patient's son and power of attorney Alinda Money. We discussed the high likelihood of a successful procedure. We discussed the risks of the procedure, including infection, bleeding, tissue injury, clip migration, and inadequate sampling. Informed written consent was given. The usual time-out protocol was performed immediately prior to the procedure. Site 1: LEFT breast 2:30 6 cm from the nipple Lesion quadrant: Upper outer quadrant Using sterile technique and 1% lidocaine and 1% lidocaine with epinephrine as local anesthetic, under direct ultrasound visualization, a 14 gauge spring-loaded device was used to perform biopsy of the mass at 2:30 6 cm from the nipple using a lateral approach. At the conclusion of the procedure a RIBBON shaped tissue marker clip was deployed into the biopsy cavity. Follow up 2 view mammogram was performed and dictated separately. Site 2: LEFT breast 2 o'clock 6 cm from the nipple Lesion quadrant: Upper outer quadrant During setup, an additional morphologically benign appearing intramammary lymph node was noted at 2 o'clock 9 cm from the nipple. This is most likely to correspond to the asymmetry of mammographic concern. However given indeterminate sonographic appearance, biopsy was performed of the LEFT breast at 2 o'clock 6 cm from the nipple as per prior recommendation. During administration of lidocaine, this area became less conspicuous and may reflect a prominent fat lobule. Using sterile technique and 1% lidocaine and 1% lidocaine with epinephrine as local anesthetic, under direct ultrasound visualization, a 14 gauge spring-loaded device was used to perform biopsy of the mass at 2 o'clock 6 cm from the nipple using a lateral approach. At the conclusion of the procedure a COIL shaped tissue marker clip was deployed into the biopsy cavity. Follow up 2 view mammogram was performed and dictated separately. IMPRESSION: 1. Ultrasound guided biopsy of a suspicious LEFT breast mass. No apparent  complications. 2. Ultrasound-guided biopsy  of a sonographically identified LEFT breast area at 2 o'clock 6 cm from the nipple. No apparent complications. This area does not correspond to the asymmetry of mammographic concern. The likely correlate for the asymmetry of mammographic concern is a morphologically benign appearing intramammary lymph node. Given patient comorbidities and advanced dementia, recommend management of this asymmetry be determined by goals of care and patient's treatment team. If it will change clinical or surgical management, repeat LEFT breast ultrasound with potential ultrasound-guided biopsy of the visualized intramammary lymph node could be considered. If surgical management is deferred and only medical management is pursued, this could be followed mammographically and sonographically on subsequent follow-up studies. Electronically Signed: By: Meda Klinefelter M.D. On: 11/12/2023 11:49   MM CLIP PLACEMENT LEFT Result Date: 11/12/2023 CLINICAL DATA:  Status post 2 site ultrasound-guided biopsy EXAM: 3D DIAGNOSTIC LEFT MAMMOGRAM POST ULTRASOUND BIOPSY COMPARISON:  Previous exam(s). FINDINGS: 3D Mammographic images were obtained following ultrasound guided biopsy of a mass at 2:30. The RIBBON biopsy marking clip is in expected position at the site of biopsy. 3D Mammographic images were obtained following ultrasound guided biopsy of an area at 2 o'clock. The COIL biopsy marking clip is in expected position at the site of biopsy. This area does not correspond to the asymmetry of mammographic concern. The likely correlate for the asymmetry of mammographic concern is a morphologically benign appearing sonographically visualized intramammary lymph node. IMPRESSION: 1. Appropriate positioning of the RIBBON shaped biopsy marking clip at the site of biopsy in the upper outer breast at the suspicious mass. 2. Expected position of the COIL biopsy marking clip in the upper outer breast at middle  depth. This area does not correspond to the asymmetry of mammographic concern. The likely correlate for the asymmetry of mammographic concern is a morphologically benign appearing intramammary lymph node. Given patient comorbidities and advanced dementia, recommend management of this asymmetry be determined by goals of care and patient's treatment team. If it will change clinical or surgical management, repeat LEFT breast ultrasound with potential ultrasound-guided biopsy of the visualized intramammary lymph node could be considered. If surgical management is deferred and only medical management is pursued, this could be followed mammographically and sonographically on subsequent follow-up studies. Final Assessment: Post Procedure Mammograms for Marker Placement Electronically Signed   By: Meda Klinefelter M.D.   On: 11/12/2023 11:51   MM 3D DIAGNOSTIC MAMMOGRAM BILATERAL BREAST Result Date: 11/03/2023 CLINICAL DATA:  82 year old female presenting with a palpable area of concern in the left breast, felt by the ordering provider on clinical breast exam. She is also due for annual screening. EXAM: DIGITAL DIAGNOSTIC BILATERAL MAMMOGRAM WITH TOMOSYNTHESIS AND CAD; ULTRASOUND LEFT BREAST LIMITED TECHNIQUE: Bilateral digital diagnostic mammography and breast tomosynthesis was performed. The images were evaluated with computer-aided detection. ; Targeted ultrasound examination of the left breast was performed. COMPARISON:  Previous exam(s). ACR Breast Density Category b: There are scattered areas of fibroglandular density. FINDINGS: MAMMOGRAM: Right: No new suspicious mass, calcification, or other findings are identified in the right breast. Left: The site of palpable concern, there is 1.8 cm spiculated mass in the upper-outer quadrant posterior depth (spot tangential image 32/49, MLO 2 of 2 image 32/47). Approximately 1.5 cm anterior/superior to the palpable mass, there is an additional 0.6 cm focal asymmetry (spot  tangential image 32/49) that is new compared to prior mammograms. Incidentally visualized vascular calcifications in the inner central left breast middle depth. No additional suspicious findings are identified in the left breast. ULTRASOUND: Targeted left  breast ultrasound was performed. At 2:30 o'clock 6 cm from the nipple, there is spiculated hypoechoic mass moderate internal vascularity. It measures 1.6 x 1.5 x 1.5 cm. This corresponds with the palpable area of concern and the spiculated mass seen on mammogram. At 2 o'clock 6 cm from the nipple, there is an oval circumscribed hypoechoic mass, without internal vascularity. It measures 0.9 x 0.3 x 0.7 cm. This corresponds with the developing asymmetry seen on mammogram. Targeted left axillary ultrasound demonstrates morphologically benign lymph nodes. No lymphadenopathy. IMPRESSION: 1. LEFT breast palpable 1.6 cm spiculated mass in the 2:30 o'clock position is highly suspicious for malignancy. 2. LEFT breast 0.9 cm mass in the 2 o'clock position is concerning for satellite lesion. 3. No LEFT axillary lymphadenopathy. 4. No evidence of malignancy in the RIGHT breast. RECOMMENDATION: Left breast ultrasound-guided biopsy of the masses in the 2:30 o'clock and 2 o'clock positions (2 site). I have discussed the findings and recommendations with the patient the patient's daughter-in-law. The biopsy procedure was discussed with the patient/family and questions were answered. Patient/family expressed their understanding of the biopsy recommendation. Patient will be scheduled for biopsy at her earliest convenience by the schedulers. Ordering provider will be notified. If applicable, a reminder letter will be sent to the patient regarding the next appointment. BI-RADS CATEGORY  5: Highly suggestive of malignancy. Electronically Signed   By: Jacob Moores M.D.   On: 11/03/2023 13:58   [place] LIMITED ULTRASOUND INCLUDING AXILLA LEFT BREAST  Result Date: 11/03/2023 CLINICAL  DATA:  82 year old female presenting with a palpable area of concern in the left breast, felt by the ordering provider on clinical breast exam. She is also due for annual screening. EXAM: DIGITAL DIAGNOSTIC BILATERAL MAMMOGRAM WITH TOMOSYNTHESIS AND CAD; ULTRASOUND LEFT BREAST LIMITED TECHNIQUE: Bilateral digital diagnostic mammography and breast tomosynthesis was performed. The images were evaluated with computer-aided detection. ; Targeted ultrasound examination of the left breast was performed. COMPARISON:  Previous exam(s). ACR Breast Density Category b: There are scattered areas of fibroglandular density. FINDINGS: MAMMOGRAM: Right: No new suspicious mass, calcification, or other findings are identified in the right breast. Left: The site of palpable concern, there is 1.8 cm spiculated mass in the upper-outer quadrant posterior depth (spot tangential image 32/49, MLO 2 of 2 image 32/47). Approximately 1.5 cm anterior/superior to the palpable mass, there is an additional 0.6 cm focal asymmetry (spot tangential image 32/49) that is new compared to prior mammograms. Incidentally visualized vascular calcifications in the inner central left breast middle depth. No additional suspicious findings are identified in the left breast. ULTRASOUND: Targeted left breast ultrasound was performed. At 2:30 o'clock 6 cm from the nipple, there is spiculated hypoechoic mass moderate internal vascularity. It measures 1.6 x 1.5 x 1.5 cm. This corresponds with the palpable area of concern and the spiculated mass seen on mammogram. At 2 o'clock 6 cm from the nipple, there is an oval circumscribed hypoechoic mass, without internal vascularity. It measures 0.9 x 0.3 x 0.7 cm. This corresponds with the developing asymmetry seen on mammogram. Targeted left axillary ultrasound demonstrates morphologically benign lymph nodes. No lymphadenopathy. IMPRESSION: 1. LEFT breast palpable 1.6 cm spiculated mass in the 2:30 o'clock position is  highly suspicious for malignancy. 2. LEFT breast 0.9 cm mass in the 2 o'clock position is concerning for satellite lesion. 3. No LEFT axillary lymphadenopathy. 4. No evidence of malignancy in the RIGHT breast. RECOMMENDATION: Left breast ultrasound-guided biopsy of the masses in the 2:30 o'clock and 2 o'clock positions (2 site).  I have discussed the findings and recommendations with the patient the patient's daughter-in-law. The biopsy procedure was discussed with the patient/family and questions were answered. Patient/family expressed their understanding of the biopsy recommendation. Patient will be scheduled for biopsy at her earliest convenience by the schedulers. Ordering provider will be notified. If applicable, a reminder letter will be sent to the patient regarding the next appointment. BI-RADS CATEGORY  5: Highly suggestive of malignancy. Electronically Signed   By: Jacob Moores M.D.   On: 11/03/2023 13:58

## 2023-11-23 NOTE — Progress Notes (Signed)
C/o lt hand pain, no injury, on and off, 5/10.  Having lumpectomy December 03, 2023, Dr. Verdis Prime @ [facility].  Discuss lymph nodes being removed and a marker being placed at [facility].   Here with son and graddaughter.

## 2023-11-25 DIAGNOSIS — Z170 Estrogen receptor positive status [ER+]: Secondary | ICD-10-CM

## 2023-11-25 HISTORY — DX: Estrogen receptor positive status (ER+): Z17.0

## 2023-11-30 ENCOUNTER — Other Ambulatory Visit: Payer: Medicare Other

## 2023-11-30 ENCOUNTER — Ambulatory Visit
Admission: RE | Admit: 2023-11-30 | Discharge: 2023-11-30 | Disposition: A | Discharge status: Home / Self Care | Payer: Medicare Other | Source: Ambulatory Visit | Attending: Surgery | Admitting: Surgery

## 2023-11-30 DIAGNOSIS — C50412 Malignant neoplasm of upper-outer quadrant of left female breast: Secondary | ICD-10-CM | POA: Diagnosis present

## 2023-11-30 DIAGNOSIS — Z170 Estrogen receptor positive status [ER+]: Secondary | ICD-10-CM | POA: Diagnosis present

## 2023-11-30 HISTORY — PX: BREAST BIOPSY: SHX20

## 2023-11-30 MED ORDER — LIDOCAINE HCL 1 % IJ SOLN
5.0000 mL | Freq: Once | INTRAMUSCULAR | Status: AC
  Administered 2023-11-30: 5 mL
  Filled 2023-11-30: qty 5

## 2023-12-01 ENCOUNTER — Other Ambulatory Visit: Payer: Self-pay

## 2023-12-01 ENCOUNTER — Encounter
Admission: RE | Admit: 2023-12-01 | Discharge: 2023-12-01 | Disposition: A | Discharge status: Home / Self Care | Payer: Medicare Other | Source: Ambulatory Visit | Attending: Surgery | Admitting: Surgery

## 2023-12-01 VITALS — Ht 62.0 in | Wt 134.0 lb

## 2023-12-01 DIAGNOSIS — E118 Type 2 diabetes mellitus with unspecified complications: Secondary | ICD-10-CM | POA: Diagnosis not present

## 2023-12-01 DIAGNOSIS — I10 Essential (primary) hypertension: Secondary | ICD-10-CM

## 2023-12-01 DIAGNOSIS — Z01818 Encounter for other preprocedural examination: Secondary | ICD-10-CM | POA: Diagnosis present

## 2023-12-01 DIAGNOSIS — Z01810 Encounter for preprocedural cardiovascular examination: Secondary | ICD-10-CM

## 2023-12-01 DIAGNOSIS — Z01812 Encounter for preprocedural laboratory examination: Secondary | ICD-10-CM

## 2023-12-01 HISTORY — DX: Malignant melanoma of skin, unspecified: C43.9

## 2023-12-01 HISTORY — DX: Type 2 diabetes mellitus without complications: E11.9

## 2023-12-01 HISTORY — DX: Vascular dementia, unspecified severity, without behavioral disturbance, psychotic disturbance, mood disturbance, and anxiety: F01.50

## 2023-12-01 HISTORY — DX: Essential (primary) hypertension: I10

## 2023-12-01 LAB — CBC
HCT: 45.3 % (ref 36.0–46.0)
Hemoglobin: 15.9 g/dL — ABNORMAL HIGH (ref 12.0–15.0)
MCH: 30.7 pg (ref 26.0–34.0)
MCHC: 35.1 g/dL (ref 30.0–36.0)
MCV: 87.5 fL (ref 80.0–100.0)
Platelets: 283 10*3/uL (ref 150–400)
RBC: 5.18 MIL/uL — ABNORMAL HIGH (ref 3.87–5.11)
RDW: 12.6 % (ref 11.5–15.5)
WBC: 4.7 10*3/uL (ref 4.0–10.5)
nRBC: 0 % (ref 0.0–0.2)

## 2023-12-01 LAB — BASIC METABOLIC PANEL
Anion gap: 11 (ref 5–15)
BUN: 10 mg/dL (ref 8–23)
CO2: 28 mmol/L (ref 22–32)
Calcium: 9.2 mg/dL (ref 8.9–10.3)
Chloride: 102 mmol/L (ref 98–111)
Creatinine, Ser: 0.67 mg/dL (ref 0.44–1.00)
GFR, Estimated: >60 mL/min (ref >60)
Glucose, Bld: 140 mg/dL — ABNORMAL HIGH (ref 70–99)
Potassium: 3.2 mmol/L — ABNORMAL LOW (ref 3.5–5.1)
Sodium: 141 mmol/L (ref 135–145)

## 2023-12-01 NOTE — Patient Instructions (Addendum)
 Your procedure is scheduled on: Thursday, January 9 Report to the Registration Desk on the 1st floor of the [facility] To find out your arrival time, please call [phone] between 1PM - 3PM on: Wednesday, January 8 If your arrival time is 6:00 am, do not arrive before that time as the [facility] entrance doors do not open until 6:00 am.  REMEMBER: Instructions that are not followed completely may result in serious medical risk, up to and including death; or upon the discretion of your surgeon and anesthesiologist your surgery may need to be rescheduled.  Do not eat food after midnight the night before surgery.  No gum chewing or hard candies.  You may however, drink water  up to 2 hours before you are scheduled to arrive for your surgery. Do not drink anything within 2 hours of your scheduled arrival time.  One week prior to surgery: Stop aspirin  and Anti-inflammatories (NSAIDS) such as Advil, Aleve, Ibuprofen, Motrin, Naproxen, Naprosyn and Aspirin  based products such as Excedrin, Goody's Powder, BC Powder. Stop ANY OVER THE COUNTER supplements until after surgery.  You may however, continue to take Tylenol  if needed for pain up until the day of surgery.  ON THE DAY OF SURGERY DO NOT TAKE ANY MEDICATIONS   No Alcohol for 24 hours before or after surgery.  No Smoking including e-cigarettes for 24 hours before surgery.  No chewable tobacco products for at least 6 hours before surgery.  No nicotine patches on the day of surgery.  Do not use any recreational drugs for at least a week (preferably 2 weeks) before your surgery.  Please be advised that the combination of cocaine and anesthesia may have negative outcomes, up to and including death. If you test positive for cocaine, your surgery will be cancelled.  On the morning of surgery brush your teeth with toothpaste and water , you may rinse your mouth with mouthwash if you wish. Do not swallow any toothpaste or  mouthwash.  Use CHG Soap as directed on instruction sheet.  Do not wear jewelry, make-up, hairpins, clips or nail polish.  For welded (permanent) jewelry: bracelets, anklets, waist bands, etc.  Please have this removed prior to surgery.  If it is not removed, there is a chance that hospital personnel will need to cut it off on the day of surgery.  Do not wear lotions, powders, or perfumes.   Do not shave body hair from the neck down 48 hours before surgery.  Contact lenses, hearing aids and dentures may not be worn into surgery.  Do not bring valuables to the hospital. [facility] is not responsible for any missing/lost belongings or valuables.   Notify your doctor if there is any change in your medical condition (cold, fever, infection).  Wear comfortable clothing (specific to your surgery type) to the hospital.  After surgery, you can help prevent lung complications by doing breathing exercises.  Take deep breaths and cough every 1-2 hours.   If you are being discharged the day of surgery, you will not be allowed to drive home. You will need a responsible individual to drive you home and stay with you for 24 hours after surgery.   If you are taking public transportation, you will need to have a responsible individual with you.  Please call the Pre-admissions Testing Dept. at [phone] if you have any questions about these instructions.  Surgery Visitation Policy:  Patients having surgery or a procedure may have two visitors.  Children under the age  of 16 must have an adult with them who is not the patient.      Preparing for Surgery with CHLORHEXIDINE  GLUCONATE (CHG) Soap  Chlorhexidine  Gluconate (CHG) Soap  o An antiseptic cleaner that kills germs and bonds with the skin to continue killing germs even after washing  o Used for showering the night before surgery and morning of surgery  Before surgery, you can play an important role by reducing the number of germs  on your skin.  CHG (Chlorhexidine  gluconate) soap is an antiseptic cleanser which kills germs and bonds with the skin to continue killing germs even after washing.  Please do not use if you have an allergy to CHG or antibacterial soaps. If your skin becomes reddened/irritated stop using the CHG.  1. Shower the NIGHT BEFORE SURGERY and the MORNING OF SURGERY with CHG soap.  2. If you choose to wash your hair, wash your hair first as usual with your normal shampoo.  3. After shampooing, rinse your hair and body thoroughly to remove the shampoo.  4. Use CHG as you would any other liquid soap. You can apply CHG directly to the skin and wash gently with a scrungie or a clean washcloth.  5. Apply the CHG soap to your body only from the neck down. Do not use on open wounds or open sores. Avoid contact with your eyes, ears, mouth, and genitals (private parts). Wash face and genitals (private parts) with your normal soap.  6. Wash thoroughly, paying special attention to the area where your surgery will be performed.  7. Thoroughly rinse your body with warm water .  8. Do not shower/wash with your normal soap after using and rinsing off the CHG soap.  9. Pat yourself dry with a clean towel.  10. Wear clean pajamas to bed the night before surgery.  12. Place clean sheets on your bed the night of your first shower and do not sleep with pets.  13. Shower again with the CHG soap on the day of surgery prior to arriving at the hospital.  14. Do not apply any deodorants/lotions/powders.  15. Please wear clean clothes to the hospital.

## 2023-12-03 ENCOUNTER — Other Ambulatory Visit: Payer: Self-pay

## 2023-12-03 ENCOUNTER — Encounter
Admission: RE | Disposition: A | Discharge status: Home / Self Care | Payer: Self-pay | Source: Home / Self Care | Attending: Surgery

## 2023-12-03 ENCOUNTER — Ambulatory Visit: Payer: Medicare Other | Admitting: Urgent Care

## 2023-12-03 ENCOUNTER — Ambulatory Visit
Admission: RE | Admit: 2023-12-03 | Discharge: 2023-12-03 | Disposition: A | Discharge status: Home / Self Care | Payer: Medicare Other | Source: Ambulatory Visit | Attending: Surgery | Admitting: Surgery

## 2023-12-03 ENCOUNTER — Encounter: Payer: Self-pay | Admitting: Surgery

## 2023-12-03 ENCOUNTER — Ambulatory Visit
Admission: RE | Admit: 2023-12-03 | Discharge: 2023-12-03 | Disposition: A | Discharge status: Home / Self Care | Payer: Medicare Other | Attending: Surgery | Admitting: Surgery

## 2023-12-03 ENCOUNTER — Other Ambulatory Visit: Payer: Medicare Other

## 2023-12-03 DIAGNOSIS — I10 Essential (primary) hypertension: Secondary | ICD-10-CM | POA: Insufficient documentation

## 2023-12-03 DIAGNOSIS — E119 Type 2 diabetes mellitus without complications: Secondary | ICD-10-CM | POA: Diagnosis not present

## 2023-12-03 DIAGNOSIS — Z87891 Personal history of nicotine dependence: Secondary | ICD-10-CM | POA: Diagnosis not present

## 2023-12-03 DIAGNOSIS — Z01812 Encounter for preprocedural laboratory examination: Secondary | ICD-10-CM

## 2023-12-03 DIAGNOSIS — E118 Type 2 diabetes mellitus with unspecified complications: Secondary | ICD-10-CM

## 2023-12-03 DIAGNOSIS — Z170 Estrogen receptor positive status [ER+]: Secondary | ICD-10-CM | POA: Insufficient documentation

## 2023-12-03 DIAGNOSIS — C50912 Malignant neoplasm of unspecified site of left female breast: Secondary | ICD-10-CM

## 2023-12-03 DIAGNOSIS — C50412 Malignant neoplasm of upper-outer quadrant of left female breast: Secondary | ICD-10-CM | POA: Diagnosis present

## 2023-12-03 HISTORY — PX: BREAST BIOPSY WITH RADIO FREQUENCY LOCALIZER: SHX6895

## 2023-12-03 LAB — GLUCOSE, CAPILLARY: Glucose-Capillary: 192 mg/dL — ABNORMAL HIGH (ref 70–99)

## 2023-12-03 SURGERY — BREAST BIOPSY WITH RADIO FREQUENCY LOCALIZER
Anesthesia: General | Site: Breast | Laterality: Left

## 2023-12-03 MED ORDER — PHENYLEPHRINE 80 MCG/ML (10ML) SYRINGE FOR IV PUSH (FOR BLOOD PRESSURE SUPPORT)
PREFILLED_SYRINGE | INTRAVENOUS | Status: DC | PRN
  Administered 2023-12-03 (×2): 80 ug via INTRAVENOUS

## 2023-12-03 MED ORDER — FENTANYL CITRATE (PF) 100 MCG/2ML IJ SOLN
INTRAMUSCULAR | Status: DC | PRN
  Administered 2023-12-03 (×6): 25 ug via INTRAVENOUS
  Administered 2023-12-03: 50 ug via INTRAVENOUS

## 2023-12-03 MED ORDER — CEFAZOLIN SODIUM-DEXTROSE 2-4 GM/100ML-% IV SOLN
INTRAVENOUS | Status: AC
  Filled 2023-12-03: qty 100

## 2023-12-03 MED ORDER — BUPIVACAINE-EPINEPHRINE (PF) 0.5% -1:200000 IJ SOLN
INTRAMUSCULAR | Status: AC
  Filled 2023-12-03: qty 10

## 2023-12-03 MED ORDER — OXYCODONE HCL 5 MG PO TABS
ORAL_TABLET | ORAL | Status: AC
  Filled 2023-12-03: qty 1

## 2023-12-03 MED ORDER — ORAL CARE MOUTH RINSE: 15.0000 mL | Freq: Once | OROMUCOSAL | Status: AC

## 2023-12-03 MED ORDER — ONDANSETRON HCL 4 MG/2ML IJ SOLN
INTRAMUSCULAR | Status: DC | PRN
  Administered 2023-12-03: 4 mg via INTRAVENOUS

## 2023-12-03 MED ORDER — DEXAMETHASONE SODIUM PHOSPHATE 10 MG/ML IJ SOLN
INTRAMUSCULAR | Status: DC | PRN
  Administered 2023-12-03: 5 mg via INTRAVENOUS

## 2023-12-03 MED ORDER — DOCUSATE SODIUM 100 MG PO CAPS: 100.0000 mg | ORAL_CAPSULE | Freq: Two times a day (BID) | ORAL | 0 refills | Status: AC | PRN

## 2023-12-03 MED ORDER — LIDOCAINE HCL (PF) 1 % IJ SOLN
INTRAMUSCULAR | Status: AC
  Filled 2023-12-03: qty 30

## 2023-12-03 MED ORDER — TRAMADOL HCL 50 MG PO TABS: 50.0000 mg | ORAL_TABLET | Freq: Four times a day (QID) | ORAL | 0 refills | Status: DC | PRN

## 2023-12-03 MED ORDER — CHLORHEXIDINE GLUCONATE 0.12 % MT SOLN
15.0000 mL | Freq: Once | OROMUCOSAL | Status: AC
  Administered 2023-12-03: 15 mL via OROMUCOSAL

## 2023-12-03 MED ORDER — BUPIVACAINE-EPINEPHRINE (PF) 0.5% -1:200000 IJ SOLN
INTRAMUSCULAR | Status: AC
  Filled 2023-12-03: qty 20

## 2023-12-03 MED ORDER — CEFAZOLIN SODIUM-DEXTROSE 2-4 GM/100ML-% IV SOLN
2.0000 g | INTRAVENOUS | Status: AC
  Administered 2023-12-03: 2 g via INTRAVENOUS

## 2023-12-03 MED ORDER — FENTANYL CITRATE (PF) 100 MCG/2ML IJ SOLN
INTRAMUSCULAR | Status: AC
  Filled 2023-12-03: qty 2

## 2023-12-03 MED ORDER — OXYCODONE HCL 5 MG PO TABS
5.0000 mg | ORAL_TABLET | Freq: Once | ORAL | Status: AC
  Administered 2023-12-03: 5 mg via ORAL

## 2023-12-03 MED ORDER — CHLORHEXIDINE GLUCONATE CLOTH 2 % EX PADS
6.0000 | MEDICATED_PAD | Freq: Once | CUTANEOUS | Status: AC
  Administered 2023-12-03: 6 via TOPICAL

## 2023-12-03 MED ORDER — DROPERIDOL 2.5 MG/ML IJ SOLN: 0.6250 mg | Freq: Once | INTRAMUSCULAR | Status: DC | PRN

## 2023-12-03 MED ORDER — LACTATED RINGERS IV SOLN: INTRAVENOUS | Status: DC

## 2023-12-03 MED ORDER — PROPOFOL 10 MG/ML IV BOLUS
INTRAVENOUS | Status: DC | PRN
  Administered 2023-12-03: 30 mg via INTRAVENOUS
  Administered 2023-12-03: 50 mg via INTRAVENOUS
  Administered 2023-12-03: 30 mg via INTRAVENOUS
  Administered 2023-12-03: 20 mg via INTRAVENOUS
  Administered 2023-12-03: 100 ug/kg/min via INTRAVENOUS
  Administered 2023-12-03: 20 mg via INTRAVENOUS
  Administered 2023-12-03 (×2): 30 mg via INTRAVENOUS

## 2023-12-03 MED ORDER — FENTANYL CITRATE (PF) 100 MCG/2ML IJ SOLN: 25.0000 ug | INTRAMUSCULAR | Status: DC | PRN

## 2023-12-03 MED ORDER — CHLORHEXIDINE GLUCONATE 0.12 % MT SOLN
OROMUCOSAL | Status: AC
  Filled 2023-12-03: qty 15

## 2023-12-03 MED ORDER — ACETAMINOPHEN 325 MG PO TABS: 650.0000 mg | ORAL_TABLET | Freq: Three times a day (TID) | ORAL | 0 refills | Status: AC | PRN

## 2023-12-03 MED ORDER — SODIUM CHLORIDE 0.9 % IV SOLN: INTRAVENOUS | Status: DC

## 2023-12-03 MED ORDER — EPHEDRINE SULFATE-NACL 50-0.9 MG/10ML-% IV SOSY
PREFILLED_SYRINGE | INTRAVENOUS | Status: DC | PRN
  Administered 2023-12-03 (×2): 10 mg via INTRAVENOUS

## 2023-12-03 MED ORDER — LIDOCAINE HCL (PF) 1 % IJ SOLN
INTRAMUSCULAR | Status: DC | PRN
  Administered 2023-12-03: 15 mL via INTRAMUSCULAR

## 2023-12-03 SURGICAL SUPPLY — 34 items
BLADE PHOTON ILLUMINATED (MISCELLANEOUS) ×1 IMPLANT
BLADE SURG 15 STRL LF DISP TIS (BLADE) ×1 IMPLANT
CHLORAPREP W/TINT 26 (MISCELLANEOUS) ×1 IMPLANT
DERMABOND ADVANCED .7 DNX12 (GAUZE/BANDAGES/DRESSINGS) ×1 IMPLANT
DEVICE DUBIN SPECIMEN MAMMOGRA (MISCELLANEOUS) ×1 IMPLANT
DRAPE LAPAROTOMY TRNSV 106X77 (MISCELLANEOUS) ×1 IMPLANT
ELECT REM PT RETURN 9FT ADLT (ELECTROSURGICAL) ×1
ELECTRODE REM PT RTRN 9FT ADLT (ELECTROSURGICAL) ×1 IMPLANT
GAUZE 4X4 16PLY ~~LOC~~+RFID DBL (SPONGE) IMPLANT
GLOVE BIOGEL PI IND STRL 7.0 (GLOVE) ×1 IMPLANT
GLOVE SURG SYN 6.5 ES PF (GLOVE) ×1 IMPLANT
GLOVE SURG SYN 6.5 PF PI (GLOVE) ×1 IMPLANT
GOWN STRL REUS W/ TWL LRG LVL3 (GOWN DISPOSABLE) ×2 IMPLANT
KIT MARKER MARGIN INK (KITS) ×1 IMPLANT
KIT TURNOVER KIT A (KITS) ×1 IMPLANT
LABEL OR SOLS (LABEL) ×1 IMPLANT
LIGHT WAVEGUIDE WIDE FLAT (MISCELLANEOUS) IMPLANT
MANIFOLD NEPTUNE II (INSTRUMENTS) ×1 IMPLANT
MARKER MARGIN CORRECT CLIP (MARKER) ×1 IMPLANT
NDL HYPO 22X1.5 SAFETY MO (MISCELLANEOUS) ×2 IMPLANT
NEEDLE HYPO 22X1.5 SAFETY MO (MISCELLANEOUS) ×1 IMPLANT
PACK BASIN MINOR ARMC (MISCELLANEOUS) ×1 IMPLANT
SHEATH BREAST BIOPSY SKIN MKR (SHEATH) ×1 IMPLANT
SUT MNCRL 4-0 27XMFL (SUTURE) ×1
SUT SILK 2-0 30XBRD TIE 12 (SUTURE) IMPLANT
SUT SILK 3 0 12 30 (SUTURE) IMPLANT
SUT SILK 3 0 SH 30 (SUTURE) IMPLANT
SUT VIC AB 3-0 SH 27X BRD (SUTURE) ×1 IMPLANT
SUTURE MNCRL 4-0 27XMF (SUTURE) ×1 IMPLANT
SYR 20ML LL LF (SYRINGE) ×1 IMPLANT
TRAP FLUID SMOKE EVACUATOR (MISCELLANEOUS) ×1 IMPLANT
TRAP NEPTUNE SPECIMEN COLLECT (MISCELLANEOUS) ×1 IMPLANT
WATER STERILE IRR 1000ML POUR (IV SOLUTION) ×1 IMPLANT
WATER STERILE IRR 500ML POUR (IV SOLUTION) ×1 IMPLANT

## 2023-12-03 NOTE — Interval H&P Note (Signed)
 No change. OK to proceed. Still will proceed with lumpectomy only.

## 2023-12-03 NOTE — Op Note (Signed)
 Preoperative diagnosis:  LEFT breast carcinoma.  Postoperative diagnosis: same.   Procedure: SCOUT tag-localized left breast partial mastectomy.                       Anesthesia: GETA  Surgeon: Dr. Henriette Pierre  Wound Classification: Clean  Indications: Patient is a 83 y.o. female with a nonpalpable left  breast mass noted on mammography with core biopsy demonstrating Ca. requires SCOUT localizer placement, partial mastectomy for treatment. No SLNB since family is not wanting to pursue any chemo.  Specimen: left Breast mass  Complications: None  Estimated Blood Loss: 20mL  Findings: 1. Specimen mammography shows marker and SCOUT localizer on specimen 2. Pathology call refers gross examination of margins was positive, anteriorlateralsuperior.  Margin resected. 3. No other palpable mass or lymph node identified.   Operation performed with curative intent:Yes  Tracer(s) used to identify sentinel nodes in the upfront surgery (non-neoadjuvant) setting (select all that apply):N/A  Tracer(s) used to identify sentinel nodes in the neoadjuvant setting (select all that apply):N/A  All nodes (colored or non-colored) present at the end of a dye-filled lymphatic channel were removed:N/A  All significantly radioactive nodes were removed:N/A  All palpable suspicious nodes were removed:N/A  Biopsy-proven positive nodes marked with clips prior to chemotherapy were identified and removed:N/A    Description of procedure: SCOUT localization was performed by radiology prior to procedure. In the nuclear medicine suite, the subareolar region was injected with Tc-99 sulfur colloid the morning of procedure. Localization studies were reviewed. The patient was taken to the operating room and placed supine on the operating table, and after general anesthesia the left  breast and axilla were prepped and draped in the usual sterile fashion. A time-out was completed verifying correct patient, procedure,  site, positioning, and implant(s) and/or special equipment prior to beginning this procedure.  By identifying the SCOUT localizer, the probable trajectory and location of the mass was visualized. A skin incision was planned in such a way as to minimize the amount of dissection to reach the mass.  The skin incision was made after infusion of local. Flaps were raised and  Sharp and blunt dissection was then taken down to the mass, taking care to include the entire SCOUT localizer and a margin of grossly normal tissue. The specimen was removed. The specimen was oriented with paint. Imaging reviewed and the entire target lesion had been resected, with biopsy clip and localizer within the specimen.Gross margin analysis by pathology noted positive anteriorsuperiorlateral.  Additional margin resected, and sent to pathology.  Wound irrigated, hemostasis was achieved and the wound closed in layers with  interrupted sutures of 3-0 Vicryl in deep dermal layer and a running subcuticular suture of Monocryl 4-0, then dressed with dermabond. The patient tolerated the procedure well and was taken to the postanesthesia care unit in stable condition. Sponge and instrument count correct at end of procedure.

## 2023-12-03 NOTE — Discharge Instructions (Signed)
 Removal, Care After This sheet gives you information about how to care for yourself after your procedure. Your health care provider may also give you more specific instructions. If you have problems or questions, contact your health care provider. What can I expect after the procedure? After the procedure, it is common to have: Soreness. Bruising. Itching. Follow these instructions at home: site care Follow instructions from your health care provider about how to take care of your site. Make sure you: Wash your hands with soap and water  before and after you change your bandage (dressing). If soap and water  are not available, use hand sanitizer. Leave stitches (sutures), skin glue, or adhesive strips in place. These skin closures may need to stay in place for 2 weeks or longer. If adhesive strip edges start to loosen and curl up, you may trim the loose edges. Do not remove adhesive strips completely unless your health care provider tells you to do that. If the area bleeds or bruises, apply gentle pressure for 10 minutes. OK TO SHOWER IN 24HRS  Check your site every day for signs of infection. Check for: Redness, swelling, or pain. Fluid or blood. Warmth. Pus or a bad smell.  General instructions Rest and then return to your normal activities as told by your health care provider.  tylenol  and advil as needed for discomfort.  Please alternate between the two every four hours as needed for pain.    Use narcotics, if prescribed, only when tylenol  and motrin is not enough to control pain. RESUME ASPIRIN  IN 48HRS  325-650mg  every 8hrs to max of 3000mg /24hrs (including the 325mg  in every norco dose) for the tylenol .    Advil up to 800mg  per dose every 8hrs as needed for pain.   Keep all follow-up visits as told by your health care provider. This is important. Contact a health care provider if: You have redness, swelling, or pain around your site. You have fluid or blood coming from your  site. Your site feels warm to the touch. You have pus or a bad smell coming from your site. You have a fever. Your sutures, skin glue, or adhesive strips loosen or come off sooner than expected. Get help right away if: You have bleeding that does not stop with pressure or a dressing. Summary After the procedure, it is common to have some soreness, bruising, and itching at the site. Follow instructions from your health care provider about how to take care of your site. Check your site every day for signs of infection. Contact a health care provider if you have redness, swelling, or pain around your site, or your site feels warm to the touch. Keep all follow-up visits as told by your health care provider. This is important. This information is not intended to replace advice given to you by your health care provider. Make sure you discuss any questions you have with your health care provider. Document Released: 12/07/2015 Document Revised: 05/10/2018 Document Reviewed: 05/10/2018 Elsevier Interactive Patient Education  2019 [employer].  i

## 2023-12-03 NOTE — Transfer of Care (Signed)
 Immediate Anesthesia Transfer of Care Note  Patient: Melody Jones  Procedure(s) Performed: BREAST BIOPSY WITH RADIO FREQUENCY LOCALIZER (Left: Breast)  Patient Location: PACU  Anesthesia Type:General  Level of Consciousness: drowsy and patient cooperative  Airway & Oxygen Therapy: Patient Spontanous Breathing  Post-op Assessment: Report given to RN and Post -op Vital signs reviewed and stable  Post vital signs: stable  Last Vitals:  Vitals Value Taken Time  BP 120/65 12/03/23 1042  Temp    Pulse 90 12/03/23 1044  Resp 15 12/03/23 1044  SpO2 94 % 12/03/23 1044  Vitals shown include unfiled device data.  Last Pain:  Vitals:   12/03/23 0807  TempSrc: Temporal  PainSc:          Complications: No notable events documented.

## 2023-12-03 NOTE — Anesthesia Preprocedure Evaluation (Signed)
 Anesthesia Evaluation  Patient identified by MRN, date of birth, ID band Patient awake    Reviewed: Allergy & Precautions, H&P , NPO status , Patient's Chart, lab work & pertinent test results, reviewed documented beta blocker date and time   History of Anesthesia Complications Negative for: history of anesthetic complications  Airway Mallampati: I  TM Distance: >3 FB Neck ROM: full    Dental  (+) Dental Advidsory Given, Edentulous Lower, Edentulous Upper   Pulmonary neg pulmonary ROS, former smoker   Pulmonary exam normal breath sounds clear to auscultation       Cardiovascular Exercise Tolerance: Good hypertension, (-) angina (-) Past MI and (-) Cardiac Stents Normal cardiovascular exam(-) dysrhythmias (-) Valvular Problems/Murmurs Rhythm:regular Rate:Normal     Neuro/Psych  PSYCHIATRIC DISORDERS Anxiety Depression   Dementia negative neurological ROS     GI/Hepatic negative GI ROS, Neg liver ROS,,,  Endo/Other  diabetes (borderline)    Renal/GU negative Renal ROS  negative genitourinary   Musculoskeletal   Abdominal   Peds  Hematology negative hematology ROS (+)   Anesthesia Other Findings Past Medical History: No date: Anxiety No date: Depression No date: Essential (primary) hypertension No date: Hyperlipidemia No date: Malignant melanoma (HCC)     Comment:  Melanoma LT Shoulder 11/2023: Malignant neoplasm of upper-outer quadrant of left breast in  female, estrogen receptor positive (HCC) No date: Osteoporosis No date: Type 2 diabetes mellitus without complication (HCC) No date: Vascular dementia (HCC)   Reproductive/Obstetrics negative OB ROS                             Anesthesia Physical Anesthesia Plan  ASA: 2  Anesthesia Plan: General   Post-op Pain Management:    Induction: Intravenous  PONV Risk Score and Plan: 3 and Ondansetron , Dexamethasone , Midazolam  and  Treatment may vary due to age or medical condition  Airway Management Planned: LMA  Additional Equipment:   Intra-op Plan:   Post-operative Plan: Extubation in OR  Informed Consent: I have reviewed the patients History and Physical, chart, labs and discussed the procedure including the risks, benefits and alternatives for the proposed anesthesia with the patient or authorized representative who has indicated his/her understanding and acceptance.     Dental Advisory Given  Plan Discussed with: Anesthesiologist, CRNA and Surgeon  Anesthesia Plan Comments:        Anesthesia Quick Evaluation

## 2023-12-04 ENCOUNTER — Encounter: Payer: Self-pay | Admitting: Surgery

## 2023-12-04 NOTE — Anesthesia Postprocedure Evaluation (Signed)
 Anesthesia Post Note  Patient: Melody Jones  Procedure(s) Performed: BREAST BIOPSY WITH RADIO FREQUENCY LOCALIZER (Left: Breast)  Patient location during evaluation: PACU Anesthesia Type: General Level of consciousness: awake and alert Pain management: pain level controlled Vital Signs Assessment: post-procedure vital signs reviewed and stable Respiratory status: spontaneous breathing, nonlabored ventilation, respiratory function stable and patient connected to nasal cannula oxygen Cardiovascular status: blood pressure returned to baseline and stable Postop Assessment: no apparent nausea or vomiting Anesthetic complications: no   There were no known notable events for this encounter.   Last Vitals:  Vitals:   12/03/23 1140 12/03/23 1150  BP: 119/75 131/84  Pulse: 67 70  Resp: 15 18  Temp: (!) 36.1 C (!) 36.1 C  SpO2: 95% 93%    Last Pain:  Vitals:   12/03/23 1150  TempSrc: Temporal  PainSc: 3                  Prentice Murphy

## 2023-12-07 ENCOUNTER — Other Ambulatory Visit: Payer: Self-pay | Admitting: Pathology

## 2023-12-07 LAB — SURGICAL PATHOLOGY

## 2023-12-21 ENCOUNTER — Other Ambulatory Visit: Payer: Medicare Other

## 2023-12-21 NOTE — Progress Notes (Signed)
Tumor Board Documentation  Melody Jones was presented by Dr. Alena Bills at our Tumor Board on 12/21/2023, which included representatives from medical oncology, radiation oncology, surgical, radiology, pathology, navigation, palliative care.  Melody Jones currently presents as a current patient with history of the following treatments: surgical intervention(s).  Additionally, we reviewed previous medical and familial history, history of present illness, and recent lab results along with all available histopathologic and imaging studies. The tumor board considered available treatment options and made the following recommendations: Surgery, Adjuvant radiation, Hormonal therapy Patient with advanced dementia.  Re-excision recommended for positive margins.   Family/healthcare POA to decide if they want to put patient through that.   Family is unsure if they will pursue radiation and antihormone therapy either.  The following procedures/referrals were also placed: No orders of the defined types were placed in this encounter.   Clinical Trial Status: not discussed   Staging used: Pathologic Stage AJCC Staging: T: T2         National site-specific guidelines NCCN were discussed with respect to the case.  Tumor board is a meeting of clinicians from various specialty areas who evaluate and discuss patients for whom a multidisciplinary approach is being considered. Final determinations in the plan of care are those of the provider(s). The responsibility for follow up of recommendations given during tumor board is that of the provider.   Today's extended care, comprehensive team conference, Melody Jones was not present for the discussion and was not examined.   Multidisciplinary Tumor Board is a multidisciplinary case peer review process.  Decisions discussed in the Multidisciplinary Tumor Board reflect the opinions of the specialists present at the conference without having examined the patient.  Ultimately,  treatment and diagnostic decisions rest with the primary provider(s) and the patient.

## 2023-12-28 ENCOUNTER — Inpatient Hospital Stay: Payer: Medicare Other | Attending: Internal Medicine | Admitting: Internal Medicine

## 2023-12-28 ENCOUNTER — Ambulatory Visit: Payer: Self-pay | Admitting: Surgery

## 2023-12-28 NOTE — H&P (Signed)
Subjective:   CC: Malignant neoplasm of upper-outer quadrant of left breast in female, estrogen receptor positive (CMS/HHS-HCC) [C50.412, Z17.0] POSTOP  HPI:  Melody Jones is a 83 y.o. female who is here for followup from above.  No issues.     Current Medications: has a current medication list which includes the following prescription(s): aspirin.  Allergies:  Allergies  Allergen Reactions   Latex Swelling   Tegaderm Ag Mesh [Silver] Itching    ROS: General: Denies weight loss, weight gain, fatigue, fevers, chills, and night sweats. Heart: Denies chest pain, palpitations, racing heart, irregular heartbeat, leg pain or swelling, and decreased activity tolerance. Respiratory: Denies breathing difficulty, shortness of breath, wheezing, cough, and sputum. GI: Denies change in appetite, heartburn, nausea, vomiting, constipation, diarrhea, and blood in stool. GU: Denies difficulty urinating, pain with urinating, urgency, frequency, blood in urine    Objective:     BP 131/71   Pulse 85   Ht 157.5 cm (5\' 2" )   Wt 60.3 kg (133 lb)   BMI 24.33 kg/m   Constitutional :  Alert, no distress, cooperative  Gastrointestinal: soft, non-tender; bowel sounds normal; no masses,  no organomegaly.   Musculoskeletal: Steady gait and movement  Skin: Cool and moist, incisions clean, dry, intact.  No erythema, induration or drainage to indicate infection.    Psychiatric: Normal affect, non-agitated, not confused       LABS:  Component 3 wk ago  SURGICAL PATHOLOGY SURGICAL PATHOLOGY [facility] [address], [address] [place], [place] [address] Telephone [phone] or [phone] Fax [phone]  REPORT OF SURGICAL PATHOLOGY   Accession #: [id number] Patient Name: Melody, Jones Visit # : [id number]  MRN: [id number] Physician: Sung Amabile DOB/Age [birth year] (Age: 39) Gender: F Collected Date: 12/03/2023 Received Date: 12/03/2023  FINAL DIAGNOSIS        1. Breast, lumpectomy, Left breast mass :      - INVASIVE CARCINOMA OF NO SPECIAL TYPE (DUCTAL), SEE NOTE.      - SEE CANCER SUMMARY BELOW.      - BIOPSY SITE CHANGE WITH ASSOCIATED CLIP.      - SCOUT TAG PRESENT.       2. Breast, lumpectomy, Left anterior lateral superior margin :      - MICROPAPILLOMA.      - NEGATIVE FOR ATYPIA AND MALIGNANCY.      - SEE NOTE.       Diagnosis Note : At the time of intraoperative consultation gross tumor was seen      at the green, orange, and red inked margins corresponding to anterior, lateral ,      and superior margins based on the provided inking scheme      In the main lumpectomy specimen (part 1) invasive carcinoma extends to the inked      green, orange, and red margins. Skeletal muscle, corresponding to the posterior      aspect of the breast, is present at the inked green and red (focally) margins.      The re-excised "left anterior lateral superior margin" specimen (part 2) was      entirely submitted for microscopic examination and residual carcinoma is not      identified.      Based on re-examination of the gross specimen, orientation of inked margins on      microscopic examination, presence of skeletal muscle at the green and focal red      inked margins, and lack of residual cancer  in part 2 it is concluded that the      inking scheme does not match the stated margins in the main lumpectomy specimen      and that the posterior, medial, and inferior margins are positive for residual      invasive carcinoma.      These findings were discussed with Dr. Tonna Boehringer by phone on 12/07/2023.      ELECTRONIC SIGNATURE : Rubinas Md, Delice Bison , Sports administrator, International aid/development worker  MICROSCOPIC DESCRIPTION 1. CANCER CASE SUMMARY: INVASIVE CARCINOMA OF THE BREAST Standard(s): AJCC-UICC 8  SPECIMEN Procedure: Lumpectomy Specimen Laterality: Left  TUMOR Histologic Type: Invasive carcinoma of no special type (ductal) Histologic Grade ([place]  Histologic Score) Glandular (Acinar)/Tubular Differentiation: 3 Nuclear Pleomorphism: 2 Mitotic Rate: 3 Overall Grade: 3 Tumor Size: Greatest dimension of largest invasive focus: 22 mm Ductal Carcinoma In Situ (DCIS): Not identified Tumor Extent: Carcinoma invades skeletal muscle Lymphatic and/or Vascular Invasion: Not identified Treatment Effect in the Breast: No known presurgical therapy  MARGINS Margin Status for Invasive Carcinoma: Margin involved by Invasive Carcinoma Posterior, extensive Inferior, extensive Medial, extensive REGIONAL LYMPH NODES Regional Lymph Node Status: Not applicable (no regional lymph nodes submitted or found)  DISTANT METASTASIS Distant Site(s) Involved, if applicable: Not applicable  PATHOLOGIC STAGE CLASSIFICATION (pTNM, AJCC 8th Edition): Modified Classification: Not applicable pT Category: pT2 T Suffix: Not applicable pN Category: pN - Not assigned (no nodes submitted or found) pM Category: Not applicable  SPECIAL STUDIES Breast Biomarker Testing Performed on Previous Biopsy: [id number] Estrogen Receptor: Positive (70%, moderate to strong) Progesterone Receptor: Positive (90%, strong) HER2 IHC: Negative (1+) (v4.10.0.0)  CASE COMMENTS STAINS USED IN DIAGNOSIS: H&E H&E H&E H&E H&E H&E H&E H&E H&E H&E H&E H&E H&E H&E H&E H&E H&E         Intraoperative Diagnosis: IOC: Tag and clip present; mass at anterior, lateral, superior margins.       Intraoperative Diagnosis: Called to Dr. Tonna Boehringer at 10:20 AM on 12/03/2023 by Dr. Oneita Kras.  CLINICAL HISTORY  SPECIMEN(S) OBTAINED 1. Breast, lumpectomy, Left Breast Mass 2. Breast, lumpectomy, Left Anterior Lateral Superior Margin  SPECIMEN COMMENTS: SPECIMEN CLINICAL INFORMATION: 1. Left breast cancer    Gross Description 1. Specimen type: Left breast lumpectomy, received fresh and placed in formalin; labeled "left breast mass".      Size: 3.7 (S-I) x 3.6 (M-L) x 2.6 (A-P) cm,  15.2 g      Orientation: The specimen is received previously oriented by the surgeon as      follows: Anterior = green, inferior = blue, lateral = orange, medial = yellow,      posterior = black, superior = red.      Localized area: An RFID localizing tag and ribbon biopsy clip are removed from      the specimen.      Cut surface: 2.2 x 1.7 x 1.4 cm focally ill-defined tumor with tan-white, solid      and firm cut surfaces and spiculated borders; the mass contains both the      localizing tag and biopsy clip; remaining cut surface consists of 80% lobulated      adipose with 20% loose, rubbery fibrous tissue.      Margins: The tumor grossly abuts anterior, superior, and lateral margins, 0.3 cm      from posterior and medial, and 0.4 cm from inferior.      Prognostic indicators: To be obtained from paraffin block as necessary.      Block summary:  1A: Closest inferior margin, perpendicular      1B: Tumor with closest anterior and lateral margins, localizing tag site      1C: Tumor with closest medial margin      1D: Additional tumor      1E: Tumor with closest superior margin      21F: Tumor with closest posterior margin, perpendicular      1G: Additional posterior margin      Collection time: 9:53 a.m. on 12/03/23      Time in formalin: 10:27 a.m. on 12/03/23 2. Specimen type: "left breast anterior lateral superior margin", received fresh and placed in formalin.      Size: 5.5 (S-I) x 3.5 (M-L) x 1.4 (A-P) cm, 9.0 g      Orientation:  The specimen is received previously oriented by the surgeon as      follows: Anterior = green, inferior = blue, lateral = orange, medial = yellow,      posterior = black, superior = red.      Localized area: None.      Cut surface: Focally nodular and firm at the anterior, lateral, and inferior      aspects; predominantly unremarkable, lobulated adipose with minimal fibrous      tissue present.      Margins: The ill-defined, nodular areas 0.2 cm from the  anterior, lateral, and      inferior margins      Prognostic indicators: To be obtained from paraffin block as necessary.      Block summary: The specimen is serially sectioned and submitted entirely in      blocks 2A-2J, with the nodular areas in 2G-2 H.      Collection time: 10:04 a.m. on 12/04/23      Time in formalin: 10:31 a.m. on 12/03/23      SMB      12/04/2023        Report signed out from the following location(s) [place] [address], [place] [address] CLIA #: [id number]  [facility] [address] [place], [place] [address] CLIA #: [id number]    RADS: N/A  Assessment:      Malignant neoplasm of upper-outer quadrant of left breast in female, estrogen receptor positive (CMS/HHS-HCC) [C50.412, Z17.0] S/p left lumpectomy  Plan:     1. Healing well. Family wishes to proceed with re-excision after some discussion  Discussed the risk of surgery including recurrence, chronic pain, post-op infxn, poor/delayed wound healing, poor cosmesis, seroma, hematoma formation, and possible re-operation to address said risks. The risks of general anesthetic, if used, includes MI, CVA, sudden death or even reaction to anesthetic medications also discussed.  Typical post-op recovery time and possbility of activity restrictions were also discussed.  Alternatives include continued observation.  Benefits include possible symptom relief, pathologic evaluation, and/or curative excision.   The patient's family verbalized understanding and all questions were answered to the patient's satisfaction.   labs/images/medications/previous chart entries reviewed personally and relevant changes/updates noted above.

## 2023-12-28 NOTE — H&P (View-Only) (Signed)
 Subjective:   CC: Malignant neoplasm of upper-outer quadrant of left breast in female, estrogen receptor positive (CMS/HHS-HCC) [C50.412, Z17.0] POSTOP  HPI:  Melody Jones is a 83 y.o. female who is here for followup from above.  No issues.     Current Medications: has a current medication list which includes the following prescription(s): aspirin.  Allergies:  Allergies  Allergen Reactions   Latex Swelling   Tegaderm Ag Mesh [Silver] Itching    ROS: General: Denies weight loss, weight gain, fatigue, fevers, chills, and night sweats. Heart: Denies chest pain, palpitations, racing heart, irregular heartbeat, leg pain or swelling, and decreased activity tolerance. Respiratory: Denies breathing difficulty, shortness of breath, wheezing, cough, and sputum. GI: Denies change in appetite, heartburn, nausea, vomiting, constipation, diarrhea, and blood in stool. GU: Denies difficulty urinating, pain with urinating, urgency, frequency, blood in urine    Objective:     BP 131/71   Pulse 85   Ht 157.5 cm (5\' 2" )   Wt 60.3 kg (133 lb)   BMI 24.33 kg/m   Constitutional :  Alert, no distress, cooperative  Gastrointestinal: soft, non-tender; bowel sounds normal; no masses,  no organomegaly.   Musculoskeletal: Steady gait and movement  Skin: Cool and moist, incisions clean, dry, intact.  No erythema, induration or drainage to indicate infection.    Psychiatric: Normal affect, non-agitated, not confused       LABS:  Component 3 wk ago  SURGICAL PATHOLOGY SURGICAL PATHOLOGY [facility] [address], [address] [place], [place] [address] Telephone [phone] or [phone] Fax [phone]  REPORT OF SURGICAL PATHOLOGY   Accession #: [id number] Patient Name: Melody Jones, Melody Jones Visit # : [id number]  MRN: [id number] Physician: Sung Amabile DOB/Age [birth year] (Age: 39) Gender: F Collected Date: 12/03/2023 Received Date: 12/03/2023  FINAL DIAGNOSIS        1. Breast, lumpectomy, Left breast mass :      - INVASIVE CARCINOMA OF NO SPECIAL TYPE (DUCTAL), SEE NOTE.      - SEE CANCER SUMMARY BELOW.      - BIOPSY SITE CHANGE WITH ASSOCIATED CLIP.      - SCOUT TAG PRESENT.       2. Breast, lumpectomy, Left anterior lateral superior margin :      - MICROPAPILLOMA.      - NEGATIVE FOR ATYPIA AND MALIGNANCY.      - SEE NOTE.       Diagnosis Note : At the time of intraoperative consultation gross tumor was seen      at the green, orange, and red inked margins corresponding to anterior, lateral ,      and superior margins based on the provided inking scheme      In the main lumpectomy specimen (part 1) invasive carcinoma extends to the inked      green, orange, and red margins. Skeletal muscle, corresponding to the posterior      aspect of the breast, is present at the inked green and red (focally) margins.      The re-excised "left anterior lateral superior margin" specimen (part 2) was      entirely submitted for microscopic examination and residual carcinoma is not      identified.      Based on re-examination of the gross specimen, orientation of inked margins on      microscopic examination, presence of skeletal muscle at the green and focal red      inked margins, and lack of residual cancer  in part 2 it is concluded that the      inking scheme does not match the stated margins in the main lumpectomy specimen      and that the posterior, medial, and inferior margins are positive for residual      invasive carcinoma.      These findings were discussed with Dr. Tonna Boehringer by phone on 12/07/2023.      ELECTRONIC SIGNATURE : Rubinas Md, Delice Bison , Sports administrator, International aid/development worker  MICROSCOPIC DESCRIPTION 1. CANCER CASE SUMMARY: INVASIVE CARCINOMA OF THE BREAST Standard(s): AJCC-UICC 8  SPECIMEN Procedure: Lumpectomy Specimen Laterality: Left  TUMOR Histologic Type: Invasive carcinoma of no special type (ductal) Histologic Grade ([place]  Histologic Score) Glandular (Acinar)/Tubular Differentiation: 3 Nuclear Pleomorphism: 2 Mitotic Rate: 3 Overall Grade: 3 Tumor Size: Greatest dimension of largest invasive focus: 22 mm Ductal Carcinoma In Situ (DCIS): Not identified Tumor Extent: Carcinoma invades skeletal muscle Lymphatic and/or Vascular Invasion: Not identified Treatment Effect in the Breast: No known presurgical therapy  MARGINS Margin Status for Invasive Carcinoma: Margin involved by Invasive Carcinoma Posterior, extensive Inferior, extensive Medial, extensive REGIONAL LYMPH NODES Regional Lymph Node Status: Not applicable (no regional lymph nodes submitted or found)  DISTANT METASTASIS Distant Site(s) Involved, if applicable: Not applicable  PATHOLOGIC STAGE CLASSIFICATION (pTNM, AJCC 8th Edition): Modified Classification: Not applicable pT Category: pT2 T Suffix: Not applicable pN Category: pN - Not assigned (no nodes submitted or found) pM Category: Not applicable  SPECIAL STUDIES Breast Biomarker Testing Performed on Previous Biopsy: [id number] Estrogen Receptor: Positive (70%, moderate to strong) Progesterone Receptor: Positive (90%, strong) HER2 IHC: Negative (1+) (v4.10.0.0)  CASE COMMENTS STAINS USED IN DIAGNOSIS: H&E H&E H&E H&E H&E H&E H&E H&E H&E H&E H&E H&E H&E H&E H&E H&E H&E         Intraoperative Diagnosis: IOC: Tag and clip present; mass at anterior, lateral, superior margins.       Intraoperative Diagnosis: Called to Dr. Tonna Boehringer at 10:20 AM on 12/03/2023 by Dr. Oneita Kras.  CLINICAL HISTORY  SPECIMEN(S) OBTAINED 1. Breast, lumpectomy, Left Breast Mass 2. Breast, lumpectomy, Left Anterior Lateral Superior Margin  SPECIMEN COMMENTS: SPECIMEN CLINICAL INFORMATION: 1. Left breast cancer    Gross Description 1. Specimen type: Left breast lumpectomy, received fresh and placed in formalin; labeled "left breast mass".      Size: 3.7 (S-I) x 3.6 (M-L) x 2.6 (A-P) cm,  15.2 g      Orientation: The specimen is received previously oriented by the surgeon as      follows: Anterior = green, inferior = blue, lateral = orange, medial = yellow,      posterior = black, superior = red.      Localized area: An RFID localizing tag and ribbon biopsy clip are removed from      the specimen.      Cut surface: 2.2 x 1.7 x 1.4 cm focally ill-defined tumor with tan-white, solid      and firm cut surfaces and spiculated borders; the mass contains both the      localizing tag and biopsy clip; remaining cut surface consists of 80% lobulated      adipose with 20% loose, rubbery fibrous tissue.      Margins: The tumor grossly abuts anterior, superior, and lateral margins, 0.3 cm      from posterior and medial, and 0.4 cm from inferior.      Prognostic indicators: To be obtained from paraffin block as necessary.      Block summary:  1A: Closest inferior margin, perpendicular      1B: Tumor with closest anterior and lateral margins, localizing tag site      1C: Tumor with closest medial margin      1D: Additional tumor      1E: Tumor with closest superior margin      21F: Tumor with closest posterior margin, perpendicular      1G: Additional posterior margin      Collection time: 9:53 a.m. on 12/03/23      Time in formalin: 10:27 a.m. on 12/03/23 2. Specimen type: "left breast anterior lateral superior margin", received fresh and placed in formalin.      Size: 5.5 (S-I) x 3.5 (M-L) x 1.4 (A-P) cm, 9.0 g      Orientation:  The specimen is received previously oriented by the surgeon as      follows: Anterior = green, inferior = blue, lateral = orange, medial = yellow,      posterior = black, superior = red.      Localized area: None.      Cut surface: Focally nodular and firm at the anterior, lateral, and inferior      aspects; predominantly unremarkable, lobulated adipose with minimal fibrous      tissue present.      Margins: The ill-defined, nodular areas 0.2 cm from the  anterior, lateral, and      inferior margins      Prognostic indicators: To be obtained from paraffin block as necessary.      Block summary: The specimen is serially sectioned and submitted entirely in      blocks 2A-2J, with the nodular areas in 2G-2 H.      Collection time: 10:04 a.m. on 12/04/23      Time in formalin: 10:31 a.m. on 12/03/23      SMB      12/04/2023        Report signed out from the following location(s) [place] [address], [place] [address] CLIA #: [id number]  [facility] [address] [place], [place] [address] CLIA #: [id number]    RADS: N/A  Assessment:      Malignant neoplasm of upper-outer quadrant of left breast in female, estrogen receptor positive (CMS/HHS-HCC) [C50.412, Z17.0] S/p left lumpectomy  Plan:     1. Healing well. Family wishes to proceed with re-excision after some discussion  Discussed the risk of surgery including recurrence, chronic pain, post-op infxn, poor/delayed wound healing, poor cosmesis, seroma, hematoma formation, and possible re-operation to address said risks. The risks of general anesthetic, if used, includes MI, CVA, sudden death or even reaction to anesthetic medications also discussed.  Typical post-op recovery time and possbility of activity restrictions were also discussed.  Alternatives include continued observation.  Benefits include possible symptom relief, pathologic evaluation, and/or curative excision.   The patient's family verbalized understanding and all questions were answered to the patient's satisfaction.   labs/images/medications/previous chart entries reviewed personally and relevant changes/updates noted above.

## 2023-12-30 ENCOUNTER — Inpatient Hospital Stay (HOSPITAL_BASED_OUTPATIENT_CLINIC_OR_DEPARTMENT_OTHER): Payer: Medicare Other | Admitting: Hospice and Palliative Medicine

## 2023-12-30 DIAGNOSIS — C50912 Malignant neoplasm of unspecified site of left female breast: Secondary | ICD-10-CM

## 2023-12-30 DIAGNOSIS — Z170 Estrogen receptor positive status [ER+]: Secondary | ICD-10-CM

## 2023-12-30 NOTE — Progress Notes (Signed)
 Multidisciplinary Oncology Council Documentation  Melody Jones was presented by our [facility] on 12/30/2023, which included representatives from:  Palliative Care Dietitian  Physical/Occupational Therapist Nurse Navigator Genetics Social work Survivorship RN Financial Navigator Research RN   Melody Jones currently presents with history of breast cancer  We reviewed previous medical and familial history, history of present illness, and recent lab results along with all available histopathologic and imaging studies. The MOC considered available treatment options and made the following recommendations/referrals:  None  The MOC is a meeting of clinicians from various specialty areas who evaluate and discuss patients for whom a multidisciplinary approach is being considered. Final determinations in the plan of care are those of the provider(s).   Today's extended care, comprehensive team conference, Melody Jones was not present for the discussion and was not examined.

## 2024-01-01 ENCOUNTER — Encounter: Payer: Self-pay | Admitting: *Deleted

## 2024-01-06 ENCOUNTER — Inpatient Hospital Stay
Admission: RE | Admit: 2024-01-06 | Discharge: 2024-01-06 | Disposition: A | Discharge status: Home / Self Care | Payer: Medicare Other | Source: Ambulatory Visit

## 2024-01-06 HISTORY — DX: Unspecified osteoarthritis, unspecified site: M19.90

## 2024-01-06 HISTORY — DX: Personal history of nicotine dependence: Z87.891

## 2024-01-06 NOTE — Patient Instructions (Addendum)
Your procedure is scheduled on:01-14-24 Thursday Report to the Registration Desk on the 1st floor of the [facility].Then proceed to the 2nd floor Surgery Desk To find out your arrival time, please call [phone] between 1PM - 3PM on:01-13-24 Wednesday If your arrival time is 6:00 am, do not arrive before that time as the [facility] entrance doors do not open until 6:00 am.  REMEMBER: Instructions that are not followed completely may result in serious medical risk, up to and including death; or upon the discretion of your surgeon and anesthesiologist your surgery may need to be rescheduled.  Do not eat food after midnight the night before surgery.  No gum chewing or hard candies.  You may however, drink CLEAR liquids up to 2 hours before you are scheduled to arrive for your surgery. Do not drink anything within 2 hours of your scheduled arrival time.  Clear liquids include: - water  - apple juice without pulp - gatorade (not RED colors) - black coffee or tea (Do NOT add milk or creamers to the coffee or tea) Do NOT drink anything that is not on this list.  One week prior to surgery:Stop NOW (01-06-24) Stop Anti-inflammatories (NSAIDS) such as Advil, Aleve, Ibuprofen, Motrin, Naproxen, Naprosyn and Aspirin based products such as Excedrin, Goody's Powder, BC Powder. Stop ANY OVER THE COUNTER supplements until after surgery.  You may however, continue to take Tylenol/Tramadol if needed for pain up until the day of surgery.  Stop 81 mg Aspirin NOW (01-06-24)  Continue taking all of your other prescription medications up until the day of surgery.  Do NOT take any medication the day of surgery  No Alcohol for 24 hours before or after surgery.  No Smoking including e-cigarettes for 24 hours before surgery.  No chewable tobacco products for at least 6 hours before surgery.  No nicotine patches on the day of surgery.  Do not use any "recreational" drugs for at least a week  (preferably 2 weeks) before your surgery.  Please be advised that the combination of cocaine and anesthesia may have negative outcomes, up to and including death. If you test positive for cocaine, your surgery will be cancelled.  On the morning of surgery brush your teeth with toothpaste and water, you may rinse your mouth with mouthwash if you wish. Do not swallow any toothpaste or mouthwash.  Use CHG Soap as directed on instruction sheet.  Do not wear jewelry, make-up, hairpins, clips or nail polish.  For welded (permanent) jewelry: bracelets, anklets, waist bands, etc.  Please have this removed prior to surgery.  If it is not removed, there is a chance that hospital personnel will need to cut it off on the day of surgery.  Do not wear lotions, powders, or perfumes.   Do not shave body hair from the neck down 48 hours before surgery.  Contact lenses, hearing aids and dentures may not be worn into surgery.  Do not bring valuables to the hospital. [facility] is not responsible for any missing/lost belongings or valuables.   Notify your doctor if there is any change in your medical condition (cold, fever, infection).  Wear comfortable clothing (specific to your surgery type) to the hospital.  After surgery, you can help prevent lung complications by doing breathing exercises.  Take deep breaths and cough every 1-2 hours. Your doctor may order a device called an Incentive Spirometer to help you take deep breaths. When coughing or sneezing, hold a pillow firmly against your incision  with both hands. This is called "splinting." Doing this helps protect your incision. It also decreases belly discomfort.  If you are being admitted to the hospital overnight, leave your suitcase in the car. After surgery it may be brought to your room.  In case of increased patient census, it may be necessary for you, the patient, to continue your postoperative care in the Same Day Surgery department.  If  you are being discharged the day of surgery, you will not be allowed to drive home. You will need a responsible individual to drive you home and stay with you for 24 hours after surgery.   If you are taking public transportation, you will need to have a responsible individual with you.  Please call the Pre-admissions Testing Dept. at [phone] if you have any questions about these instructions.  Surgery Visitation Policy:  Patients having surgery or a procedure may have two visitors.  Children under the age of 40 must have an adult with them who is not the patient.  Temporary Visitor Restrictions Due to increasing cases of flu, RSV and COVID-19: Children ages 68 and under will not be able to visit patients in [facility] hospitals under most circumstances.     Preparing for Surgery with CHLORHEXIDINE GLUCONATE (CHG) Soap  Chlorhexidine Gluconate (CHG) Soap  o An antiseptic cleaner that kills germs and bonds with the skin to continue killing germs even after washing  o Used for showering the night before surgery and morning of surgery  Before surgery, you can play an important role by reducing the number of germs on your skin.  CHG (Chlorhexidine gluconate) soap is an antiseptic cleanser which kills germs and bonds with the skin to continue killing germs even after washing.  Please do not use if you have an allergy to CHG or antibacterial soaps. If your skin becomes reddened/irritated stop using the CHG.  1. Shower the NIGHT BEFORE SURGERY and the MORNING OF SURGERY with CHG soap.  2. If you choose to wash your hair, wash your hair first as usual with your normal shampoo.  3. After shampooing, rinse your hair and body thoroughly to remove the shampoo.  4. Use CHG as you would any other liquid soap. You can apply CHG directly to the skin and wash gently with a scrungie or a clean washcloth.  5. Apply the CHG soap to your body only from the neck down. Do not use on open  wounds or open sores. Avoid contact with your eyes, ears, mouth, and genitals (private parts). Wash face and genitals (private parts) with your normal soap.  6. Wash thoroughly, paying special attention to the area where your surgery will be performed.  7. Thoroughly rinse your body with warm water.  8. Do not shower/wash with your normal soap after using and rinsing off the CHG soap.  9. Pat yourself dry with a clean towel.  10. Wear clean pajamas to bed the night before surgery.  12. Place clean sheets on your bed the night of your first shower and do not sleep with pets.  13. Shower again with the CHG soap on the day of surgery prior to arriving at the hospital.  14. Do not apply any deodorants/lotions/powders.  15. Please wear clean clothes to the hospital.

## 2024-01-07 ENCOUNTER — Other Ambulatory Visit: Payer: Self-pay

## 2024-01-07 ENCOUNTER — Encounter
Admission: RE | Admit: 2024-01-07 | Discharge: 2024-01-07 | Disposition: A | Discharge status: Home / Self Care | Payer: Medicare Other | Source: Ambulatory Visit | Attending: Surgery | Admitting: Surgery

## 2024-01-07 DIAGNOSIS — I10 Essential (primary) hypertension: Secondary | ICD-10-CM

## 2024-01-07 DIAGNOSIS — Z01812 Encounter for preprocedural laboratory examination: Secondary | ICD-10-CM

## 2024-01-07 DIAGNOSIS — E118 Type 2 diabetes mellitus with unspecified complications: Secondary | ICD-10-CM

## 2024-01-07 NOTE — Patient Instructions (Addendum)
Your procedure is scheduled on: Thursday, February 20 Report to the Registration Desk on the 1st floor of the [facility] To find out your arrival time, please call [phone] between 1PM - 3PM on: Wednesday, February 19 If your arrival time is 6:00 am, do not arrive before that time as the [facility] entrance doors do not open until 6:00 am.  REMEMBER: Instructions that are not followed completely may result in serious medical risk, up to and including death; or upon the discretion of your surgeon and anesthesiologist your surgery may need to be rescheduled.  Do not eat food after midnight the night before surgery.  No gum chewing or hard candies.  You may however, drink CLEAR liquids up to 2 hours before you are scheduled to arrive for your surgery. Do not drink anything within 2 hours of your scheduled arrival time.  Clear liquids include: - water  - apple juice without pulp - gatorade (not RED colors) - black coffee or tea (Do NOT add milk or creamers to the coffee or tea) Do NOT drink anything that is not on this list.  One week prior to surgery: starting February 13 Stop aspirin and Anti-inflammatories (NSAIDS) such as Advil, Aleve, Ibuprofen, Motrin, Naproxen, Naprosyn and Aspirin based products such as Excedrin, Goody's Powder, BC Powder. Stop ANY OVER THE COUNTER supplements until after surgery.  You may however, continue to take Tylenol if needed for pain up until the day of surgery.  Continue taking all of your other prescription medications up until the day of surgery.  ON THE DAY OF SURGERY ONLY TAKE THESE MEDICATIONS WITH SIPS OF WATER:  Tramadol if needed for pain  No Alcohol for 24 hours before or after surgery.  No Smoking including e-cigarettes for 24 hours before surgery.  No chewable tobacco products for at least 6 hours before surgery.  No nicotine patches on the day of surgery.  Do not use any "recreational" drugs for at least a week (preferably 2  weeks) before your surgery.  Please be advised that the combination of cocaine and anesthesia may have negative outcomes, up to and including death. If you test positive for cocaine, your surgery will be cancelled.  On the morning of surgery brush your teeth with toothpaste and water, you may rinse your mouth with mouthwash if you wish. Do not swallow any toothpaste or mouthwash.  Use CHG Soap as directed on instruction sheet.  Do not wear jewelry, make-up, hairpins, clips or nail polish.  For welded (permanent) jewelry: bracelets, anklets, waist bands, etc.  Please have this removed prior to surgery.  If it is not removed, there is a chance that hospital personnel will need to cut it off on the day of surgery.  Do not wear lotions, powders, or perfumes.   Do not shave body hair from the neck down 48 hours before surgery.  Contact lenses, hearing aids and dentures may not be worn into surgery.  Do not bring valuables to the hospital. [facility] is not responsible for any missing/lost belongings or valuables.   Notify your doctor if there is any change in your medical condition (cold, fever, infection).  Wear comfortable clothing (specific to your surgery type) to the hospital.  After surgery, you can help prevent lung complications by doing breathing exercises.  Take deep breaths and cough every 1-2 hours.   If you are being discharged the day of surgery, you will not be allowed to drive home. You will need a responsible  individual to drive you home and stay with you for 24 hours after surgery.   If you are taking public transportation, you will need to have a responsible individual with you.  Please call the Pre-admissions Testing Dept. at [phone] if you have any questions about these instructions.  Surgery Visitation Policy:  Patients having surgery or a procedure may have two visitors.  Children under the age of 2 must have an adult with them who is not the  patient.  Temporary Visitor Restrictions Due to increasing cases of flu, RSV and COVID-19: Children ages 30 and under will not be able to visit patients in [facility] hospitals under most circumstances.       Preparing for Surgery with CHLORHEXIDINE GLUCONATE (CHG) Soap  Chlorhexidine Gluconate (CHG) Soap  o An antiseptic cleaner that kills germs and bonds with the skin to continue killing germs even after washing  o Used for showering the night before surgery and morning of surgery  Before surgery, you can play an important role by reducing the number of germs on your skin.  CHG (Chlorhexidine gluconate) soap is an antiseptic cleanser which kills germs and bonds with the skin to continue killing germs even after washing.  Please do not use if you have an allergy to CHG or antibacterial soaps. If your skin becomes reddened/irritated stop using the CHG.  1. Shower the NIGHT BEFORE SURGERY and the MORNING OF SURGERY with CHG soap.  2. If you choose to wash your hair, wash your hair first as usual with your normal shampoo.  3. After shampooing, rinse your hair and body thoroughly to remove the shampoo.  4. Use CHG as you would any other liquid soap. You can apply CHG directly to the skin and wash gently with a scrungie or a clean washcloth.  5. Apply the CHG soap to your body only from the neck down. Do not use on open wounds or open sores. Avoid contact with your eyes, ears, mouth, and genitals (private parts). Wash face and genitals (private parts) with your normal soap.  6. Wash thoroughly, paying special attention to the area where your surgery will be performed.  7. Thoroughly rinse your body with warm water.  8. Do not shower/wash with your normal soap after using and rinsing off the CHG soap.  9. Pat yourself dry with a clean towel.  10. Wear clean pajamas to bed the night before surgery.  12. Place clean sheets on your bed the night of your first shower and do not  sleep with pets.  13. Shower again with the CHG soap on the day of surgery prior to arriving at the hospital.  14. Do not apply any deodorants/lotions/powders.  15. Please wear clean clothes to the hospital.

## 2024-01-11 ENCOUNTER — Encounter: Payer: Self-pay | Admitting: Urgent Care

## 2024-01-11 ENCOUNTER — Encounter
Admission: RE | Admit: 2024-01-11 | Discharge: 2024-01-11 | Disposition: A | Discharge status: Home / Self Care | Payer: Medicare Other | Source: Ambulatory Visit | Attending: Surgery | Admitting: Surgery

## 2024-01-11 DIAGNOSIS — Z01812 Encounter for preprocedural laboratory examination: Secondary | ICD-10-CM | POA: Diagnosis present

## 2024-01-11 DIAGNOSIS — E118 Type 2 diabetes mellitus with unspecified complications: Secondary | ICD-10-CM | POA: Insufficient documentation

## 2024-01-11 DIAGNOSIS — I10 Essential (primary) hypertension: Secondary | ICD-10-CM | POA: Diagnosis not present

## 2024-01-11 LAB — BASIC METABOLIC PANEL
Anion gap: 11 (ref 5–15)
BUN: 11 mg/dL (ref 8–23)
CO2: 30 mmol/L (ref 22–32)
Calcium: 9.1 mg/dL (ref 8.9–10.3)
Chloride: 100 mmol/L (ref 98–111)
Creatinine, Ser: 0.77 mg/dL (ref 0.44–1.00)
GFR, Estimated: >60 mL/min (ref >60)
Glucose, Bld: 123 mg/dL — ABNORMAL HIGH (ref 70–99)
Potassium: 3.3 mmol/L — ABNORMAL LOW (ref 3.5–5.1)
Sodium: 141 mmol/L (ref 135–145)

## 2024-01-14 ENCOUNTER — Ambulatory Visit: Payer: Medicare Other | Admitting: Anesthesiology

## 2024-01-14 ENCOUNTER — Ambulatory Visit
Admission: RE | Admit: 2024-01-14 | Discharge: 2024-01-14 | Disposition: A | Discharge status: Home / Self Care | Payer: Medicare Other | Attending: Surgery | Admitting: Surgery

## 2024-01-14 ENCOUNTER — Other Ambulatory Visit: Payer: Self-pay

## 2024-01-14 ENCOUNTER — Encounter: Payer: Self-pay | Admitting: Surgery

## 2024-01-14 ENCOUNTER — Encounter
Admission: RE | Disposition: A | Discharge status: Home / Self Care | Payer: Self-pay | Source: Home / Self Care | Attending: Surgery

## 2024-01-14 DIAGNOSIS — C50412 Malignant neoplasm of upper-outer quadrant of left female breast: Secondary | ICD-10-CM | POA: Diagnosis present

## 2024-01-14 DIAGNOSIS — Z170 Estrogen receptor positive status [ER+]: Secondary | ICD-10-CM | POA: Diagnosis not present

## 2024-01-14 DIAGNOSIS — C50912 Malignant neoplasm of unspecified site of left female breast: Secondary | ICD-10-CM

## 2024-01-14 HISTORY — PX: RE-EXCISION OF BREAST LUMPECTOMY: SHX6048

## 2024-01-14 LAB — GLUCOSE, CAPILLARY: Glucose-Capillary: 130 mg/dL — ABNORMAL HIGH (ref 70–99)

## 2024-01-14 SURGERY — EXCISION, LESION, BREAST
Anesthesia: General | Site: Breast | Laterality: Left

## 2024-01-14 MED ORDER — ACETAMINOPHEN 325 MG PO TABS: 650.0000 mg | ORAL_TABLET | Freq: Three times a day (TID) | ORAL | 0 refills | Status: AC | PRN

## 2024-01-14 MED ORDER — DEXAMETHASONE SODIUM PHOSPHATE 10 MG/ML IJ SOLN
INTRAMUSCULAR | Status: AC
  Filled 2024-01-14: qty 1

## 2024-01-14 MED ORDER — EPHEDRINE SULFATE-NACL 50-0.9 MG/10ML-% IV SOSY
PREFILLED_SYRINGE | INTRAVENOUS | Status: DC | PRN
  Administered 2024-01-14: 10 mg via INTRAVENOUS

## 2024-01-14 MED ORDER — BUPIVACAINE-EPINEPHRINE (PF) 0.5% -1:200000 IJ SOLN
INTRAMUSCULAR | Status: AC
  Filled 2024-01-14: qty 10

## 2024-01-14 MED ORDER — PHENYLEPHRINE 80 MCG/ML (10ML) SYRINGE FOR IV PUSH (FOR BLOOD PRESSURE SUPPORT)
PREFILLED_SYRINGE | INTRAVENOUS | Status: AC
  Filled 2024-01-14: qty 10

## 2024-01-14 MED ORDER — EPHEDRINE 5 MG/ML INJ
INTRAVENOUS | Status: AC
  Filled 2024-01-14: qty 5

## 2024-01-14 MED ORDER — KETOROLAC TROMETHAMINE 30 MG/ML IJ SOLN
INTRAMUSCULAR | Status: AC
  Filled 2024-01-14: qty 1

## 2024-01-14 MED ORDER — ACETAMINOPHEN 10 MG/ML IV SOLN
INTRAVENOUS | Status: AC
  Filled 2024-01-14: qty 100

## 2024-01-14 MED ORDER — LIDOCAINE HCL (PF) 2 % IJ SOLN
INTRAMUSCULAR | Status: AC
  Filled 2024-01-14: qty 5

## 2024-01-14 MED ORDER — LIDOCAINE HCL (PF) 1 % IJ SOLN
INTRAMUSCULAR | Status: AC
  Filled 2024-01-14: qty 30

## 2024-01-14 MED ORDER — ONDANSETRON HCL 4 MG/2ML IJ SOLN
INTRAMUSCULAR | Status: AC
  Filled 2024-01-14: qty 2

## 2024-01-14 MED ORDER — PHENYLEPHRINE 80 MCG/ML (10ML) SYRINGE FOR IV PUSH (FOR BLOOD PRESSURE SUPPORT)
PREFILLED_SYRINGE | INTRAVENOUS | Status: DC | PRN
  Administered 2024-01-14: 80 ug via INTRAVENOUS

## 2024-01-14 MED ORDER — LIDOCAINE HCL 1 % IJ SOLN
INTRAMUSCULAR | Status: DC | PRN
  Administered 2024-01-14: 35 mL via INTRAMUSCULAR

## 2024-01-14 MED ORDER — ACETAMINOPHEN 10 MG/ML IV SOLN
INTRAVENOUS | Status: DC | PRN
  Administered 2024-01-14: 1000 mg via INTRAVENOUS

## 2024-01-14 MED ORDER — PROPOFOL 10 MG/ML IV BOLUS
INTRAVENOUS | Status: AC
  Filled 2024-01-14: qty 20

## 2024-01-14 MED ORDER — LACTATED RINGERS IV SOLN: INTRAVENOUS | Status: DC | PRN

## 2024-01-14 MED ORDER — STERILE WATER FOR IRRIGATION IR SOLN
Status: DC | PRN
  Administered 2024-01-14: 150 mL

## 2024-01-14 MED ORDER — MIDAZOLAM HCL 2 MG/2ML IJ SOLN
INTRAMUSCULAR | Status: AC
  Filled 2024-01-14: qty 2

## 2024-01-14 MED ORDER — PROPOFOL 10 MG/ML IV BOLUS
INTRAVENOUS | Status: DC | PRN
  Administered 2024-01-14: 70 mg via INTRAVENOUS
  Administered 2024-01-14: 50 ug/kg/min via INTRAVENOUS
  Administered 2024-01-14: 60 mg via INTRAVENOUS
  Administered 2024-01-14: 30 mg via INTRAVENOUS
  Administered 2024-01-14: 40 mg via INTRAVENOUS

## 2024-01-14 MED ORDER — TRAMADOL HCL 50 MG PO TABS: 50.0000 mg | ORAL_TABLET | Freq: Four times a day (QID) | ORAL | 0 refills | Status: AC | PRN

## 2024-01-14 MED ORDER — DEXAMETHASONE SODIUM PHOSPHATE 10 MG/ML IJ SOLN
INTRAMUSCULAR | Status: DC | PRN
  Administered 2024-01-14: 8 mg via INTRAVENOUS

## 2024-01-14 MED ORDER — DOCUSATE SODIUM 100 MG PO CAPS: 100.0000 mg | ORAL_CAPSULE | Freq: Two times a day (BID) | ORAL | 0 refills | Status: AC | PRN

## 2024-01-14 MED ORDER — CEFAZOLIN SODIUM-DEXTROSE 2-4 GM/100ML-% IV SOLN
2.0000 g | INTRAVENOUS | Status: AC
  Administered 2024-01-14: 2 g via INTRAVENOUS

## 2024-01-14 MED ORDER — CHLORHEXIDINE GLUCONATE 0.12 % MT SOLN
15.0000 mL | Freq: Once | OROMUCOSAL | Status: AC
  Administered 2024-01-14: 15 mL via OROMUCOSAL

## 2024-01-14 MED ORDER — FENTANYL CITRATE (PF) 100 MCG/2ML IJ SOLN: 25.0000 ug | INTRAMUSCULAR | Status: DC | PRN

## 2024-01-14 MED ORDER — OXYCODONE HCL 5 MG PO TABS: 5.0000 mg | ORAL_TABLET | Freq: Once | ORAL | Status: DC | PRN

## 2024-01-14 MED ORDER — CHLORHEXIDINE GLUCONATE 0.12 % MT SOLN
OROMUCOSAL | Status: AC
  Filled 2024-01-14: qty 15

## 2024-01-14 MED ORDER — CHLORHEXIDINE GLUCONATE CLOTH 2 % EX PADS
6.0000 | MEDICATED_PAD | Freq: Once | CUTANEOUS | Status: AC
  Administered 2024-01-14: 6 via TOPICAL

## 2024-01-14 MED ORDER — FENTANYL CITRATE (PF) 100 MCG/2ML IJ SOLN
INTRAMUSCULAR | Status: DC | PRN
  Administered 2024-01-14 (×2): 25 ug via INTRAVENOUS
  Administered 2024-01-14: 50 ug via INTRAVENOUS

## 2024-01-14 MED ORDER — CEFAZOLIN SODIUM-DEXTROSE 2-4 GM/100ML-% IV SOLN
INTRAVENOUS | Status: AC
  Filled 2024-01-14: qty 100

## 2024-01-14 MED ORDER — LIDOCAINE HCL (CARDIAC) PF 100 MG/5ML IV SOSY
PREFILLED_SYRINGE | INTRAVENOUS | Status: DC | PRN
  Administered 2024-01-14: 60 mg via INTRAVENOUS

## 2024-01-14 MED ORDER — ORAL CARE MOUTH RINSE: 15.0000 mL | Freq: Once | OROMUCOSAL | Status: AC

## 2024-01-14 MED ORDER — FENTANYL CITRATE (PF) 100 MCG/2ML IJ SOLN
INTRAMUSCULAR | Status: AC
  Filled 2024-01-14: qty 2

## 2024-01-14 MED ORDER — PROPOFOL 1000 MG/100ML IV EMUL
INTRAVENOUS | Status: AC
  Filled 2024-01-14: qty 100

## 2024-01-14 MED ORDER — OXYCODONE HCL 5 MG/5ML PO SOLN: 5.0000 mg | Freq: Once | ORAL | Status: DC | PRN

## 2024-01-14 MED ORDER — ONDANSETRON HCL 4 MG/2ML IJ SOLN
INTRAMUSCULAR | Status: DC | PRN
  Administered 2024-01-14: 4 mg via INTRAVENOUS

## 2024-01-14 MED ORDER — SODIUM CHLORIDE 0.9 % IV SOLN: INTRAVENOUS | Status: DC

## 2024-01-14 SURGICAL SUPPLY — 31 items
BLADE PHOTON ILLUMINATED (MISCELLANEOUS) ×1 IMPLANT
BLADE SURG 15 STRL LF DISP TIS (BLADE) ×1 IMPLANT
CHLORAPREP W/TINT 26 (MISCELLANEOUS) IMPLANT
DERMABOND ADVANCED .7 DNX12 (GAUZE/BANDAGES/DRESSINGS) ×1 IMPLANT
DEVICE DUBIN SPECIMEN MAMMOGRA (MISCELLANEOUS) ×1 IMPLANT
DRAPE LAPAROTOMY 77X122 PED (DRAPES) ×1 IMPLANT
ELECT REM PT RETURN 9FT ADLT (ELECTROSURGICAL) ×1
ELECTRODE REM PT RTRN 9FT ADLT (ELECTROSURGICAL) ×1 IMPLANT
GLOVE BIOGEL PI IND STRL 7.0 (GLOVE) ×1 IMPLANT
GLOVE SURG SYN 6.5 ES PF (GLOVE) ×3
GLOVE SURG SYN 6.5 PF PI (GLOVE) ×1 IMPLANT
GOWN STRL REUS W/ TWL LRG LVL3 (GOWN DISPOSABLE) ×2 IMPLANT
KIT MARKER MARGIN INK (KITS) ×1 IMPLANT
KIT TURNOVER KIT A (KITS) ×1 IMPLANT
LABEL OR SOLS (LABEL) ×1 IMPLANT
LIGHT WAVEGUIDE WIDE FLAT (MISCELLANEOUS) IMPLANT
MANIFOLD NEPTUNE II (INSTRUMENTS) ×1 IMPLANT
MARKER MARGIN CORRECT CLIP (MARKER) ×1 IMPLANT
NDL HYPO 22X1.5 SAFETY MO (MISCELLANEOUS) ×1 IMPLANT
NEEDLE HYPO 22X1.5 SAFETY MO (MISCELLANEOUS) ×1
PACK BASIN MINOR ARMC (MISCELLANEOUS) ×1 IMPLANT
SUT MNCRL 4-0 27 PS-2 XMFL (SUTURE) ×2
SUT SILK 3 0 12 30 (SUTURE) IMPLANT
SUT SILK 3 0 SH 30 (SUTURE) IMPLANT
SUT VIC AB 3-0 SH 27X BRD (SUTURE) ×1 IMPLANT
SUTURE MNCRL 4-0 27XMF (SUTURE) ×1 IMPLANT
SYR 20ML LL LF (SYRINGE) ×1 IMPLANT
SYR BULB IRRIG 60ML STRL (SYRINGE) ×1 IMPLANT
TRAP FLUID SMOKE EVACUATOR (MISCELLANEOUS) ×1 IMPLANT
TRAP NEPTUNE SPECIMEN COLLECT (MISCELLANEOUS) ×1 IMPLANT
WATER STERILE IRR 500ML POUR (IV SOLUTION) ×1 IMPLANT

## 2024-01-14 NOTE — Anesthesia Preprocedure Evaluation (Signed)
Anesthesia Evaluation  Patient identified by MRN, date of birth, ID band Patient confused    Reviewed: Allergy & Precautions, H&P , NPO status , Patient's Chart, lab work & pertinent test results, reviewed documented beta blocker date and time   History of Anesthesia Complications Negative for: history of anesthetic complications  Airway Mallampati: I  TM Distance: >3 FB Neck ROM: full    Dental  (+) Dental Advidsory Given, Edentulous Lower, Edentulous Upper   Pulmonary neg pulmonary ROS, former smoker   Pulmonary exam normal breath sounds clear to auscultation       Cardiovascular Exercise Tolerance: Good hypertension, (-) angina (-) Past MI and (-) Cardiac Stents negative cardio ROS Normal cardiovascular exam(-) dysrhythmias (-) Valvular Problems/Murmurs Rhythm:regular Rate:Normal     Neuro/Psych  PSYCHIATRIC DISORDERS Anxiety Depression   Dementia negative neurological ROS  negative psych ROS   GI/Hepatic negative GI ROS, Neg liver ROS,,,  Endo/Other  negative endocrine ROSdiabetes    Renal/GU negative Renal ROS  negative genitourinary   Musculoskeletal   Abdominal   Peds  Hematology negative hematology ROS (+)   Anesthesia Other Findings Past Medical History: No date: Anxiety No date: Depression No date: Essential (primary) hypertension No date: Hyperlipidemia No date: Malignant melanoma (HCC)     Comment:  Melanoma LT Shoulder 11/2023: Malignant neoplasm of upper-outer quadrant of left breast in  female, estrogen receptor positive (HCC) No date: Osteoporosis No date: Type 2 diabetes mellitus without complication (HCC) No date: Vascular dementia (HCC)   Reproductive/Obstetrics negative OB ROS                             Anesthesia Physical Anesthesia Plan  ASA: 2  Anesthesia Plan: General   Post-op Pain Management:    Induction: Intravenous  PONV Risk Score and Plan: 3  and Ondansetron, Dexamethasone, Midazolam and Treatment may vary due to age or medical condition  Airway Management Planned: LMA  Additional Equipment:   Intra-op Plan:   Post-operative Plan: Extubation in OR  Informed Consent: I have reviewed the patients History and Physical, chart, labs and discussed the procedure including the risks, benefits and alternatives for the proposed anesthesia with the patient or authorized representative who has indicated his/her understanding and acceptance.     Dental Advisory Given  Plan Discussed with: Anesthesiologist, CRNA and Surgeon  Anesthesia Plan Comments: (Patient and patient's son consented for risks of anesthesia including but not limited to:  - adverse reactions to medications - damage to eyes, teeth, lips or other oral mucosa - nerve damage due to positioning  - sore throat or hoarseness - Damage to heart, brain, nerves, lungs, other parts of body or loss of life  Patient and patient's son voiced understanding and assent.)       Anesthesia Quick Evaluation

## 2024-01-14 NOTE — Interval H&P Note (Signed)
History and Physical Interval Note:  01/14/2024 11:20 AM  Melody Jones  has presented today for surgery, with the diagnosis of C50.412 Z17.0 malignant neoplasm of upper-outer quadrant of lt breast, estrogen receptor positive.  The various methods of treatment have been discussed with the patient and family. After consideration of risks, benefits and other options for treatment, the patient has consented to  Procedure(s): RE-EXCISION OF BREAST LUMPECTOMY (Left) as a surgical intervention.  The patient's history has been reviewed, patient examined, no change in status, stable for surgery.  I have reviewed the patient's chart and labs.  Questions were answered to the patient's satisfaction.     Jerald Villalona Tonna Boehringer

## 2024-01-14 NOTE — Discharge Instructions (Addendum)
excision, Care After This sheet gives you information about how to care for yourself after your procedure. Your health care provider may also give you more specific instructions. If you have problems or questions, contact your health care provider. What can I expect after the procedure? After the procedure, it is common to have: Soreness. Bruising. Itching. Follow these instructions at home: site care Follow instructions from your health care provider about how to take care of your site. Make sure you: Wash your hands with soap and water before and after you change your bandage (dressing). If soap and water are not available, use hand sanitizer. Leave stitches (sutures), skin glue, or adhesive strips in place. These skin closures may need to stay in place for 2 weeks or longer. If adhesive strip edges start to loosen and curl up, you may trim the loose edges. Do not remove adhesive strips completely unless your health care provider tells you to do that. If the area bleeds or bruises, apply gentle pressure for 10 minutes. OK TO SHOWER IN 24HRS  Check your site every day for signs of infection. Check for: Redness, swelling, or pain. Fluid or blood. Warmth. Pus or a bad smell.  General instructions Rest and then return to your normal activities as told by your health care provider.  tylenol as needed for discomfort.    Use narcotics, if prescribed, only when tylenol is not enough to control pain.  325-650mg  every 8hrs to max of 3000mg /24hrs (including the 325mg  in every norco dose) for the tylenol.   Keep all follow-up visits as told by your health care provider. This is important. RESUME ASPIRIN IN 48HRS Contact a health care provider if: You have redness, swelling, or pain around your site. You have fluid or blood coming from your site. Your site feels warm to the touch. You have pus or a bad smell coming from your site. You have a fever. Your sutures, skin glue, or adhesive strips  loosen or come off sooner than expected. Get help right away if: You have bleeding that does not stop with pressure or a dressing. Summary After the procedure, it is common to have some soreness, bruising, and itching at the site. Follow instructions from your health care provider about how to take care of your site. Check your site every day for signs of infection. Contact a health care provider if you have redness, swelling, or pain around your site, or your site feels warm to the touch. Keep all follow-up visits as told by your health care provider. This is important. This information is not intended to replace advice given to you by your health care provider. Make sure you discuss any questions you have with your health care provider. Document Released: 12/07/2015 Document Revised: 05/10/2018 Document Reviewed: 05/10/2018 Elsevier Interactive Patient Education  [employer].

## 2024-01-14 NOTE — Anesthesia Procedure Notes (Signed)
Procedure Name: LMA Insertion Date/Time: 01/14/2024 12:06 PM  Performed by: Rich Brave, CRNAPre-anesthesia Checklist: Patient identified, Patient being monitored, Timeout performed, Emergency Drugs available and Suction available Patient Re-evaluated:Patient Re-evaluated prior to induction Oxygen Delivery Method: Circle system utilized Preoxygenation: Pre-oxygenation with 100% oxygen Induction Type: IV induction Ventilation: Mask ventilation without difficulty LMA: LMA inserted LMA Size: 3.0 Tube type: Oral Number of attempts: 1 Placement Confirmation: positive ETCO2 and breath sounds checked- equal and bilateral Tube secured with: Tape Dental Injury: Teeth and Oropharynx as per pre-operative assessment

## 2024-01-14 NOTE — Op Note (Signed)
Pre-Op Dx: Positive margins after partial mastectomy Post-Op Dx: Same Anesthesia: LMA EBL: 20 mL Complications:  none apparent Specimen: Posterior medial inferior margin Procedure: excisional biopsy posterior medial inferior margin for positive margins after partial mastectomy Surgeon: Tonna Boehringer  Indications for procedure: See H&P  Description of Procedure:  Consent obtained, time out performed.  Patient placed in supine position.  Area sterilized and draped in usual position.  Local infused to area previously marked.  Previous incision site opened again dissection carried down to an obvious seroma capsule where former malignant tumor located.  As noted in the H&P and final path report, the positive margins at the medial inferior and posterior aspect then removed from surrounding tissue completely using electrocautery.  The margins was passed off operative field in the same orientation as it was excised and then painted with the proper orientation confirmed multiple times.  Portion of the medial margin was approximated to recreate the capsule like structure that was noted within the breast at time of excision.  Pathology called and confirmed that pain orientation was consistent with margins seen in that all margins were clear at this time.    Wound hemostasis noted, then closed in two layer fashion with 3-0 vicryl in interrupted fashion for deep dermal layer, then running 4-0 monocryl in subcuticular fashion for epidermal layer.  Wound then dressed with dermabond.  Pt tolerated procedure well, and transferred to PACU in stable condition. Sponge and instrument count correct at end of procedure.

## 2024-01-14 NOTE — Transfer of Care (Signed)
Immediate Anesthesia Transfer of Care Note  Patient: Melody Jones  Procedure(s) Performed: RE-EXCISION OF BREAST LUMPECTOMY (Left: Breast)  Patient Location: PACU  Anesthesia Type:General  Level of Consciousness: awake, drowsy, and patient cooperative  Airway & Oxygen Therapy: Patient Spontanous Breathing and Patient connected to nasal cannula oxygen  Post-op Assessment: Report given to RN, Post -op Vital signs reviewed and stable, and Patient moving all extremities X 4  Post vital signs: Reviewed and stable  Last Vitals:  Vitals Value Taken Time  BP 112/54 01/14/24 1321  Temp 36.4 C 01/14/24 1321  Pulse 82 01/14/24 1324  Resp 17 01/14/24 1324  SpO2 99 % 01/14/24 1324  Vitals shown include unfiled device data.  Last Pain:  Vitals:   01/14/24 1321  TempSrc:   PainSc: Asleep         Complications: No notable events documented.

## 2024-01-14 NOTE — Anesthesia Postprocedure Evaluation (Signed)
Anesthesia Post Note  Patient: Melody Jones  Procedure(s) Performed: RE-EXCISION OF BREAST LUMPECTOMY (Left: Breast)  Patient location during evaluation: PACU Anesthesia Type: General Level of consciousness: awake and alert Pain management: pain level controlled Vital Signs Assessment: post-procedure vital signs reviewed and stable Respiratory status: spontaneous breathing, nonlabored ventilation, respiratory function stable and patient connected to nasal cannula oxygen Cardiovascular status: blood pressure returned to baseline and stable Postop Assessment: no apparent nausea or vomiting Anesthetic complications: no  No notable events documented.   Last Vitals:  Vitals:   01/14/24 1113 01/14/24 1321  BP: (!) 146/74 (!) 112/54  Pulse: 66 90  Resp: 18 17  Temp: 36.9 C (!) 36.4 C  SpO2: 99% 98%    Last Pain:  Vitals:   01/14/24 1321  TempSrc:   PainSc: Asleep                 Stephanie Coup

## 2024-01-15 ENCOUNTER — Encounter: Payer: Self-pay | Admitting: Surgery

## 2024-01-18 ENCOUNTER — Other Ambulatory Visit: Payer: Self-pay | Admitting: Pathology

## 2024-01-18 LAB — SURGICAL PATHOLOGY

## 2024-02-01 ENCOUNTER — Encounter: Payer: Self-pay | Admitting: *Deleted

## 2024-02-01 ENCOUNTER — Other Ambulatory Visit: Payer: Medicare Other

## 2024-02-01 NOTE — Progress Notes (Signed)
 Tumor Board Documentation  Melody Jones was presented by Irving Shows, RN at our Tumor Board on 02/01/2024, which included representatives from medical oncology, radiation oncology, surgical, radiology, pathology, navigation.  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  Additionally, we reviewed previous medical and familial history, history of present illness, and recent lab results along with all available histopathologic and imaging studies. The tumor board considered available treatment options and made the following recommendations: Adjuvant radiation, Hormonal therapy    The following procedures/referrals were also placed: No orders of the defined types were placed in this encounter.   Clinical Trial Status: not discussed   Staging used: AJCC Staging:           National site-specific guidelines   were discussed with respect to the case.  Tumor board is a meeting of clinicians from various specialty areas who evaluate and discuss patients for whom a multidisciplinary approach is being considered. Final determinations in the plan of care are those of the provider(s). The responsibility for follow up of recommendations given during tumor board is that of the provider.   Today's extended care, comprehensive team conference, Melody Jones was not present for the discussion and was not examined.   Multidisciplinary Tumor Board is a multidisciplinary case peer review process.  Decisions discussed in the Multidisciplinary Tumor Board reflect the opinions of the specialists present at the conference without having examined the patient.  Ultimately, treatment and diagnostic decisions rest with the primary provider(s) and the patient.

## 2024-02-01 NOTE — Progress Notes (Signed)
 Left VM for family to see if they would like to r/s missed appointment with Dr. Alena Bills.

## 2024-04-05 ENCOUNTER — Other Ambulatory Visit: Payer: Self-pay | Admitting: Orthopedic Surgery

## 2024-04-05 DIAGNOSIS — M79632 Pain in left forearm: Secondary | ICD-10-CM

## 2024-04-14 ENCOUNTER — Ambulatory Visit
Admission: RE | Admit: 2024-04-14 | Discharge: 2024-04-14 | Disposition: A | Discharge status: Home / Self Care | Source: Ambulatory Visit | Attending: Orthopedic Surgery | Admitting: Orthopedic Surgery

## 2024-04-14 DIAGNOSIS — M79632 Pain in left forearm: Secondary | ICD-10-CM | POA: Diagnosis present
